# Patient Record
Sex: Female | Born: 1940 | Race: White | Hispanic: No | Smoking: Never smoker
Health system: Southern US, Community
[De-identification: ages and names within clinical notes are randomized; demographics above are authoritative.]

## PROBLEM LIST (undated history)

## (undated) DIAGNOSIS — K5900 Constipation, unspecified: Secondary | ICD-10-CM

## (undated) DIAGNOSIS — N329 Bladder disorder, unspecified: Secondary | ICD-10-CM

## (undated) DIAGNOSIS — Z9889 Other specified postprocedural states: Secondary | ICD-10-CM

## (undated) DIAGNOSIS — H40009 Preglaucoma, unspecified, unspecified eye: Secondary | ICD-10-CM

## (undated) DIAGNOSIS — N819 Female genital prolapse, unspecified: Secondary | ICD-10-CM

## (undated) DIAGNOSIS — R112 Nausea with vomiting, unspecified: Secondary | ICD-10-CM

## (undated) DIAGNOSIS — N816 Rectocele: Secondary | ICD-10-CM

## (undated) DIAGNOSIS — Z973 Presence of spectacles and contact lenses: Secondary | ICD-10-CM

## (undated) HISTORY — PX: TONSILLECTOMY: SUR1361

## (undated) HISTORY — PX: BREAST BIOPSY: SHX20

## (undated) HISTORY — PX: VAGINAL HYSTERECTOMY: SUR661

## (undated) HISTORY — PX: OTHER SURGICAL HISTORY: SHX169

## (undated) HISTORY — DX: Rectocele: N81.6

---

## 2001-09-18 ENCOUNTER — Encounter: Admission: RE | Admit: 2001-09-18 | Discharge: 2001-09-18 | Payer: Self-pay | Admitting: General Practice

## 2001-09-18 ENCOUNTER — Encounter: Payer: Self-pay | Admitting: General Practice

## 2003-01-24 ENCOUNTER — Encounter: Payer: Self-pay | Admitting: General Practice

## 2003-01-24 ENCOUNTER — Encounter: Admission: RE | Admit: 2003-01-24 | Discharge: 2003-01-24 | Payer: Self-pay | Admitting: General Practice

## 2005-04-11 ENCOUNTER — Encounter: Admission: RE | Admit: 2005-04-11 | Discharge: 2005-04-11 | Payer: Self-pay | Admitting: Internal Medicine

## 2008-10-06 ENCOUNTER — Encounter: Admission: RE | Admit: 2008-10-06 | Discharge: 2008-10-06 | Payer: Self-pay | Admitting: Internal Medicine

## 2009-10-09 ENCOUNTER — Encounter: Admission: RE | Admit: 2009-10-09 | Discharge: 2009-10-09 | Payer: Self-pay | Admitting: Family Medicine

## 2010-09-27 ENCOUNTER — Other Ambulatory Visit: Payer: Self-pay | Admitting: Family Medicine

## 2010-09-27 DIAGNOSIS — Z1231 Encounter for screening mammogram for malignant neoplasm of breast: Secondary | ICD-10-CM

## 2010-10-12 ENCOUNTER — Ambulatory Visit
Admission: RE | Admit: 2010-10-12 | Discharge: 2010-10-12 | Disposition: A | Discharge status: Home / Self Care | Payer: Medicare Other | Source: Ambulatory Visit | Attending: Family Medicine | Admitting: Family Medicine

## 2010-10-12 DIAGNOSIS — Z1231 Encounter for screening mammogram for malignant neoplasm of breast: Secondary | ICD-10-CM

## 2010-10-13 ENCOUNTER — Other Ambulatory Visit: Payer: Self-pay | Admitting: Family Medicine

## 2010-10-13 DIAGNOSIS — R928 Other abnormal and inconclusive findings on diagnostic imaging of breast: Secondary | ICD-10-CM

## 2010-10-15 ENCOUNTER — Ambulatory Visit
Admission: RE | Admit: 2010-10-15 | Discharge: 2010-10-15 | Disposition: A | Discharge status: Home / Self Care | Payer: Medicare PPO | Source: Ambulatory Visit | Attending: Family Medicine | Admitting: Family Medicine

## 2010-10-15 DIAGNOSIS — R928 Other abnormal and inconclusive findings on diagnostic imaging of breast: Secondary | ICD-10-CM

## 2011-03-07 ENCOUNTER — Other Ambulatory Visit: Payer: Self-pay | Admitting: Family Medicine

## 2011-03-07 DIAGNOSIS — N6310 Unspecified lump in the right breast, unspecified quadrant: Secondary | ICD-10-CM

## 2011-04-05 ENCOUNTER — Other Ambulatory Visit: Payer: Self-pay | Admitting: Family Medicine

## 2011-04-05 ENCOUNTER — Ambulatory Visit
Admission: RE | Admit: 2011-04-05 | Discharge: 2011-04-05 | Disposition: A | Discharge status: Home / Self Care | Payer: Medicare PPO | Source: Ambulatory Visit | Attending: Family Medicine | Admitting: Family Medicine

## 2011-04-05 DIAGNOSIS — N6310 Unspecified lump in the right breast, unspecified quadrant: Secondary | ICD-10-CM

## 2011-04-11 ENCOUNTER — Ambulatory Visit
Admission: RE | Admit: 2011-04-11 | Discharge: 2011-04-11 | Disposition: A | Discharge status: Home / Self Care | Payer: Medicare PPO | Source: Ambulatory Visit | Attending: Family Medicine | Admitting: Family Medicine

## 2011-04-11 DIAGNOSIS — N6310 Unspecified lump in the right breast, unspecified quadrant: Secondary | ICD-10-CM

## 2011-04-13 HISTORY — PX: BREAST BIOPSY: SHX20

## 2011-07-01 ENCOUNTER — Encounter (HOSPITAL_BASED_OUTPATIENT_CLINIC_OR_DEPARTMENT_OTHER): Payer: Self-pay | Admitting: *Deleted

## 2011-07-01 ENCOUNTER — Other Ambulatory Visit: Payer: Self-pay | Admitting: Orthopedic Surgery

## 2011-07-01 NOTE — Progress Notes (Signed)
Pt went to [facility] after fall-kept her overnight -hit her chin,fx lt wrist-will call for notes

## 2011-07-05 ENCOUNTER — Encounter (HOSPITAL_BASED_OUTPATIENT_CLINIC_OR_DEPARTMENT_OTHER): Payer: Self-pay | Admitting: Anesthesiology

## 2011-07-05 ENCOUNTER — Ambulatory Visit (HOSPITAL_BASED_OUTPATIENT_CLINIC_OR_DEPARTMENT_OTHER): Payer: Medicare PPO | Admitting: Anesthesiology

## 2011-07-05 ENCOUNTER — Encounter (HOSPITAL_BASED_OUTPATIENT_CLINIC_OR_DEPARTMENT_OTHER)
Admission: RE | Disposition: A | Discharge status: Home / Self Care | Payer: Self-pay | Source: Ambulatory Visit | Attending: Orthopedic Surgery

## 2011-07-05 ENCOUNTER — Ambulatory Visit (HOSPITAL_BASED_OUTPATIENT_CLINIC_OR_DEPARTMENT_OTHER)
Admission: RE | Admit: 2011-07-05 | Discharge: 2011-07-05 | Disposition: A | Discharge status: Home / Self Care | Payer: Medicare PPO | Source: Ambulatory Visit | Attending: Orthopedic Surgery | Admitting: Orthopedic Surgery

## 2011-07-05 ENCOUNTER — Encounter (HOSPITAL_BASED_OUTPATIENT_CLINIC_OR_DEPARTMENT_OTHER): Payer: Self-pay | Admitting: *Deleted

## 2011-07-05 DIAGNOSIS — Y998 Other external cause status: Secondary | ICD-10-CM | POA: Insufficient documentation

## 2011-07-05 DIAGNOSIS — W19XXXA Unspecified fall, initial encounter: Secondary | ICD-10-CM | POA: Insufficient documentation

## 2011-07-05 DIAGNOSIS — S52599A Other fractures of lower end of unspecified radius, initial encounter for closed fracture: Secondary | ICD-10-CM | POA: Insufficient documentation

## 2011-07-05 DIAGNOSIS — Y92009 Unspecified place in unspecified non-institutional (private) residence as the place of occurrence of the external cause: Secondary | ICD-10-CM | POA: Insufficient documentation

## 2011-07-05 HISTORY — DX: Other specified postprocedural states: R11.2

## 2011-07-05 HISTORY — DX: Other specified postprocedural states: Z98.890

## 2011-07-05 HISTORY — PX: ORIF RADIAL FRACTURE: SHX5113

## 2011-07-05 LAB — POCT HEMOGLOBIN-HEMACUE: Hemoglobin: 13.8 g/dL (ref 12.0–15.0)

## 2011-07-05 SURGERY — OPEN REDUCTION INTERNAL FIXATION (ORIF) RADIAL FRACTURE
Anesthesia: General | Site: Wrist | Laterality: Left | Wound class: Clean

## 2011-07-05 MED ORDER — DOXYCYCLINE HYCLATE 100 MG PO TABS: 100.0000 mg | ORAL_TABLET | Freq: Two times a day (BID) | ORAL | Status: AC

## 2011-07-05 MED ORDER — FENTANYL CITRATE 0.05 MG/ML IJ SOLN
50.0000 ug | INTRAMUSCULAR | Status: DC | PRN
  Administered 2011-07-05: 100 ug via INTRAVENOUS

## 2011-07-05 MED ORDER — DEXAMETHASONE SODIUM PHOSPHATE 10 MG/ML IJ SOLN
INTRAMUSCULAR | Status: DC | PRN
  Administered 2011-07-05: 4 mg via INTRAVENOUS

## 2011-07-05 MED ORDER — LIDOCAINE HCL (CARDIAC) 20 MG/ML IV SOLN
INTRAVENOUS | Status: DC | PRN
  Administered 2011-07-05: 50 mg via INTRAVENOUS

## 2011-07-05 MED ORDER — MIDAZOLAM HCL 2 MG/2ML IJ SOLN
0.5000 mg | INTRAMUSCULAR | Status: DC | PRN
  Administered 2011-07-05: 2 mg via INTRAVENOUS

## 2011-07-05 MED ORDER — VANCOMYCIN HCL IN DEXTROSE 1-5 GM/200ML-% IV SOLN
1000.0000 mg | Freq: Once | INTRAVENOUS | Status: AC
  Administered 2011-07-05: 1000 mg via INTRAVENOUS

## 2011-07-05 MED ORDER — IBUPROFEN 800 MG PO TABS: 800.0000 mg | ORAL_TABLET | Freq: Three times a day (TID) | ORAL | Status: AC | PRN

## 2011-07-05 MED ORDER — LIDOCAINE HCL 1 % IJ SOLN
INTRAMUSCULAR | Status: DC | PRN
  Administered 2011-07-05: 2 mL via INTRADERMAL

## 2011-07-05 MED ORDER — CHLORHEXIDINE GLUCONATE 4 % EX LIQD: 60.0000 mL | Freq: Once | CUTANEOUS | Status: DC

## 2011-07-05 MED ORDER — LACTATED RINGERS IV SOLN
INTRAVENOUS | Status: DC
  Administered 2011-07-05: 11:00:00 via INTRAVENOUS

## 2011-07-05 MED ORDER — ROPIVACAINE HCL 5 MG/ML IJ SOLN
INTRAMUSCULAR | Status: DC | PRN
  Administered 2011-07-05: 25 mL via EPIDURAL

## 2011-07-05 MED ORDER — OXYCODONE-ACETAMINOPHEN 5-325 MG PO TABS: 1.0000 | ORAL_TABLET | ORAL | Status: AC | PRN

## 2011-07-05 MED ORDER — PROPOFOL 10 MG/ML IV EMUL
INTRAVENOUS | Status: DC | PRN
  Administered 2011-07-05: 200 mg via INTRAVENOUS

## 2011-07-05 MED ORDER — ONDANSETRON HCL 4 MG/2ML IJ SOLN
INTRAMUSCULAR | Status: DC | PRN
  Administered 2011-07-05: 4 mg via INTRAVENOUS

## 2011-07-05 SURGICAL SUPPLY — 69 items
BAG DECANTER FOR FLEXI CONT (MISCELLANEOUS) IMPLANT
BANDAGE ADHESIVE 1X3 (GAUZE/BANDAGES/DRESSINGS) IMPLANT
BANDAGE ELASTIC 3 VELCRO ST LF (GAUZE/BANDAGES/DRESSINGS) ×4 IMPLANT
BANDAGE GAUZE ELAST BULKY 4 IN (GAUZE/BANDAGES/DRESSINGS) ×4 IMPLANT
BLADE MINI RND TIP GREEN BEAV (BLADE) ×2 IMPLANT
BLADE SURG 15 STRL LF DISP TIS (BLADE) ×2 IMPLANT
BLADE SURG 15 STRL SS (BLADE) ×2
BNDG ESMARK 4X9 LF (GAUZE/BANDAGES/DRESSINGS) ×2 IMPLANT
BONE CHIP PRESERV 5CC PCAN5 (Bone Implant) ×2 IMPLANT
BRUSH SCRUB EZ PLAIN DRY (MISCELLANEOUS) ×2 IMPLANT
CANISTER SUCTION 1200CC (MISCELLANEOUS) ×2 IMPLANT
CLOTH BEACON ORANGE TIMEOUT ST (SAFETY) ×2 IMPLANT
CORDS BIPOLAR (ELECTRODE) ×2 IMPLANT
COVER MAYO STAND STRL (DRAPES) ×2 IMPLANT
COVER TABLE BACK 60X90 (DRAPES) ×2 IMPLANT
CUFF TOURNIQUET SINGLE 18IN (TOURNIQUET CUFF) ×2 IMPLANT
DECANTER SPIKE VIAL GLASS SM (MISCELLANEOUS) IMPLANT
DRAPE EXTREMITY T 121X128X90 (DRAPE) ×2 IMPLANT
DRAPE OEC MINIVIEW 54X84 (DRAPES) ×2 IMPLANT
DRAPE SURG 17X23 STRL (DRAPES) ×2 IMPLANT
GAUZE XEROFORM 1X8 LF (GAUZE/BANDAGES/DRESSINGS) IMPLANT
GLOVE BIO SURGEON STRL SZ7 (GLOVE) ×2 IMPLANT
GLOVE BIOGEL M STRL SZ7.5 (GLOVE) ×4 IMPLANT
GLOVE ORTHO TXT STRL SZ7.5 (GLOVE) ×2 IMPLANT
GOWN PREVENTION PLUS XLARGE (GOWN DISPOSABLE) ×2 IMPLANT
GOWN PREVENTION PLUS XXLARGE (GOWN DISPOSABLE) IMPLANT
LOOP VESSEL MAXI BLUE (MISCELLANEOUS) IMPLANT
NEEDLE 27GAX1X1/2 (NEEDLE) IMPLANT
NS IRRIG 1000ML POUR BTL (IV SOLUTION) ×2 IMPLANT
PACK BASIN DAY SURGERY FS (CUSTOM PROCEDURE TRAY) ×2 IMPLANT
PAD CAST 3X4 CTTN HI CHSV (CAST SUPPLIES) ×1 IMPLANT
PADDING CAST ABS 4INX4YD NS (CAST SUPPLIES) ×1
PADDING CAST ABS COTTON 4X4 ST (CAST SUPPLIES) ×1 IMPLANT
PADDING CAST COTTON 3X4 STRL (CAST SUPPLIES) ×1
PEG SUBCHONDRAL SMOOTH 2.0X16 (Peg) ×2 IMPLANT
PEG SUBCHONDRAL SMOOTH 2.0X18 (Peg) ×4 IMPLANT
PEG SUBCHONDRAL SMOOTH 2.0X20 (Peg) ×4 IMPLANT
PEG SUBCHONDRAL SMOOTH 2.0X22 (Peg) ×2 IMPLANT
PLATE STAN 24.4X59.5 LT (Plate) ×2 IMPLANT
SCREW BN 12X3.5XNS CORT TI (Screw) ×2 IMPLANT
SCREW BN 13X3.5XNS CORT TI (Screw) ×1 IMPLANT
SCREW CORT 3.5X10 LNG (Screw) ×2 IMPLANT
SCREW CORT 3.5X12 (Screw) ×2 IMPLANT
SCREW CORT 3.5X13 (Screw) ×1 IMPLANT
SCREW PEG LOCK 2.5X16 (Peg) ×2 IMPLANT
SLEEVE SCD COMPRESS KNEE MED (MISCELLANEOUS) ×2 IMPLANT
SLING ARM FOAM STRAP LRG (SOFTGOODS) ×2 IMPLANT
SPLINT PLASTER CAST XFAST 3X15 (CAST SUPPLIES) ×15 IMPLANT
SPLINT PLASTER XTRA FASTSET 3X (CAST SUPPLIES) ×15
SPONGE GAUZE 4X4 12PLY (GAUZE/BANDAGES/DRESSINGS) ×2 IMPLANT
STOCKINETTE 4X48 STRL (DRAPES) ×2 IMPLANT
STRIP CLOSURE SKIN 1/2X4 (GAUZE/BANDAGES/DRESSINGS) ×2 IMPLANT
SUCTION FRAZIER TIP 10 FR DISP (SUCTIONS) IMPLANT
SUT ETHIBOND 3-0 V-5 (SUTURE) IMPLANT
SUT PROLENE 3 0 PS 2 (SUTURE) ×2 IMPLANT
SUT VIC AB 0 SH 27 (SUTURE) ×2 IMPLANT
SUT VIC AB 2-0 PS2 27 (SUTURE) IMPLANT
SUT VIC AB 3-0 FS2 27 (SUTURE) IMPLANT
SUT VIC AB 4-0 BRD 54 (SUTURE) IMPLANT
SUT VIC AB 4-0 P-3 18XBRD (SUTURE) ×1 IMPLANT
SUT VIC AB 4-0 P3 18 (SUTURE) ×1
SYR 3ML 23GX1 SAFETY (SYRINGE) IMPLANT
SYR BULB 3OZ (MISCELLANEOUS) ×2 IMPLANT
SYR CONTROL 10ML LL (SYRINGE) ×2 IMPLANT
TOWEL OR 17X24 6PK STRL BLUE (TOWEL DISPOSABLE) ×2 IMPLANT
TRAY DSU PREP LF (CUSTOM PROCEDURE TRAY) ×2 IMPLANT
TUBE CONNECTING 20X1/4 (TUBING) ×4 IMPLANT
UNDERPAD 30X30 INCONTINENT (UNDERPADS AND DIAPERS) ×2 IMPLANT
WATER STERILE IRR 1000ML POUR (IV SOLUTION) IMPLANT

## 2011-07-05 NOTE — Anesthesia Procedure Notes (Addendum)
Anesthesia Regional Block:  Supraclavicular block  Pre-Anesthetic Checklist: ,, timeout performed, Correct Patient, Correct Site, Correct Laterality, Correct Procedure, Correct Position, site marked, Risks and benefits discussed,  Surgical consent,  Pre-op evaluation,  At surgeon's request and post-op pain management  Laterality: Left  Prep: chloraprep       Needles:   Needle Type: Other   (Arrow Echogenic)   Needle Length: 9cm  Needle Gauge: 21    Additional Needles:  Procedures: ultrasound guided Supraclavicular block Narrative:  Start time: 07/05/2011 11:22 AM End time: 07/05/2011 11:28 AM Injection made incrementally with aspirations every 5 mL.  Performed by: Personally  Anesthesiologist: C Frederick  Additional Notes: Ultrasound guidance used to: id relevant anatomy, confirm needle position, local anesthetic spread, avoidance of vascular puncture. Picture saved. No complications. Block performed personally by Janetta Hora. Gelene Mink, MD    Supraclavicular block Procedure Name: LMA Insertion Performed by: Sharyne Richters Pre-anesthesia Checklist: Patient identified, Timeout performed, Emergency Drugs available, Suction available and Patient being monitored Patient Re-evaluated:Patient Re-evaluated prior to inductionOxygen Delivery Method: Circle System Utilized Preoxygenation: Pre-oxygenation with 100% oxygen Intubation Type: IV induction Ventilation: Mask ventilation without difficulty LMA: LMA with gastric port inserted LMA Size: 4.0 Number of attempts: 1 Placement Confirmation: breath sounds checked- equal and bilateral and positive ETCO2 Tube secured with: Tape Dental Injury: Teeth and Oropharynx as per pre-operative assessment

## 2011-07-05 NOTE — H&P (Signed)
  Jaime Weber is an 71 y.o. female.   Chief Complaint: Complaining of left wrist fracture HPI: . 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   She was seen at the [facility] where she was noted to have a profoundly comminuted die punch type Barton's fracture of the left distal radius.   Past Medical History  Diagnosis Date  . Complication of anesthesia   . PONV (postoperative nausea and vomiting)   . No pertinent past medical history     Past Surgical History  Procedure Date  . Vertebroplasty 2009    lumbar  . Elbow arthroplasty     rt elbow fx  . Abdominal hysterectomy   . Appendectomy   . Tonsillectomy     History reviewed. No pertinent family history. Social History:  reports that she has never smoked. She does not have any smokeless tobacco history on file. She reports that she drinks alcohol. She reports that she does not use illicit drugs.  Allergies:  Allergies  Allergen Reactions  . Cephalosporins Rash  . Penicillins Rash  . Sulfa Antibiotics Rash  . Hydrocodone   . Prednisone   . Benadryl (Diphenhydramine Hcl) Rash    Medications Prior to Admission  Medication Dose Route Frequency Provider Last Rate Last Dose  . chlorhexidine (HIBICLENS) 4 % liquid 4 application  60 mL Topical Once       . lactated ringers infusion   Intravenous Continuous Constance Goltz, MD       No current outpatient prescriptions on file as of 07/05/2011.    No results found for this or any previous visit (from the past 48 hour(s)).  No results found.   Pertinent items are noted in HPI.  Blood pressure 141/83, pulse 74, temperature 98 F (36.7 C), temperature source Oral, resp. rate 20, height 5\' 8"  (1.727 m), weight 73.936 kg (163 lb), SpO2 99.00%.  General appearance: alert Head: Normocephalic, without obvious abnormality Neck: supple,  symmetrical, trachea midline Resp: clear to auscultation bilaterally Cardio: regular rate and rhythm, S1, S2 normal, no murmur, click, rub or gallop GI: normal findings: bowel sounds normal Extremities: Left wrist is in a sugar tong type splint. This was removed. There was obvious deformity of the distal radius he showed diffuse ecchymosis and swelling from the distal forearm to the fingertips. Review of her x-rays revealed a severely comminuted Barton's type fracture of the left distal radius. Vascularly the hand was intact. Pulses: 2+ and symmetric Skin: normal Neurologic: Grossly normal    Assessment/Plan Impression: Severely comminuted left distal radius fracture  Plan patient to be taken to the operating room to undergo ORIF of left distal radius fracture with DVR plate fixation and possible bone grafting. The procedure risks benefits and postoperative course were again discussed with the patient and her daughter and they were in agreement with this plan.  DASNOIT,Pratyush Ammon J 07/05/2011, 10:55 AM    H&P documentation: 07/05/2011  -History and Physical Reviewed  -Patient has been re-examined  -No change in the plan of care  Wyn Forster, MD

## 2011-07-05 NOTE — Anesthesia Postprocedure Evaluation (Signed)
  Anesthesia Post-op Note  Patient: Jaime Weber  Procedure(s) Performed:  OPEN REDUCTION INTERNAL FIXATION (ORIF) RADIAL FRACTURE  Patient Location: PACU  Anesthesia Type: GA combined with regional for post-op pain  Level of Consciousness: awake, alert  and oriented2  Airway and Oxygen Therapy: Patient Spontanous Breathing  Post-op Pain: none  Post-op Assessment: Post-op Vital signs reviewed, Patient's Cardiovascular Status Stable, Respiratory Function Stable, Patent Airway, No signs of Nausea or vomiting and Pain level controlled  Post-op Vital Signs: Reviewed and stable  Complications: No apparent anesthesia complications

## 2011-07-05 NOTE — Brief Op Note (Signed)
07/05/2011  2:29 PM  PATIENT:  Thomasene Mohair  71 y.o. female  PRE-OPERATIVE DIAGNOSIS:  complex radius fracture left wrist, comminuted articular Barton's fracture            POST-OPERATIVE DIAGNOSIS:  complex radius fracture left wrist, comminuted articular Barton's fracture  PROCEDURE:  Procedure(s): OPEN REDUCTION INTERNAL FIXATION (ORIF) RADIUS FRACTURE LEFT WITH CANCELLOUS ALLOGRAFT BUTTRESS OF ARTICULAR FRAGMENTS  SURGEON:  Surgeon(s): Wyn Forster., MD  PHYSICIAN ASSISTANT:   ASSISTANTS:  Mallory Shirk.A-C    ANESTHESIA:   general  EBL:  Total I/O In: 2000 [I.V.:2000] Out: -   BLOOD ADMINISTERED:none  DRAINS: none   LOCAL MEDICATIONS USED:  NONE  SPECIMEN:  No Specimen  DISPOSITION OF SPECIMEN:  N/A  COUNTS:  YES  TOURNIQUET:  * Missing tourniquet times found for documented tourniquets in log:  [id number] *  DICTATION: .Other Dictation: Dictation Number [id number]  PLAN OF CARE: Discharge to home after PACU  PATIENT DISPOSITION:  PACU - hemodynamically stable.

## 2011-07-05 NOTE — Transfer of Care (Signed)
Immediate Anesthesia Transfer of Care Note  Patient: Jaime Weber  Procedure(s) Performed:  OPEN REDUCTION INTERNAL FIXATION (ORIF) RADIAL FRACTURE  Patient Location: PACU  Anesthesia Type: General  Level of Consciousness: awake  Airway & Oxygen Therapy: Patient Spontanous Breathing  Post-op Assessment: Report given to PACU RN and Post -op Vital signs reviewed and stable  Post vital signs: Reviewed and stable  Complications: No apparent anesthesia complications

## 2011-07-05 NOTE — Op Note (Signed)
Op note dictated 07/05/11 161096

## 2011-07-05 NOTE — Progress Notes (Signed)
Assisted Dr. Frederick with left, ultrasound guided, supraclavicular block. Side rails up, monitors on throughout procedure. See vital signs in flow sheet. Tolerated Procedure well. 

## 2011-07-05 NOTE — Anesthesia Preprocedure Evaluation (Signed)
Anesthesia Evaluation  Patient identified by MRN, date of birth, ID band Patient awake    Reviewed: Allergy & Precautions, H&P , NPO status , Patient's Chart, lab work & pertinent test results, reviewed documented beta blocker date and time   History of Anesthesia Complications (+) PONV  Airway Mallampati: II TM Distance: >3 FB Neck ROM: full    Dental   Pulmonary neg pulmonary ROS,          Cardiovascular neg cardio ROS     Neuro/Psych Negative Neurological ROS  Negative Psych ROS   GI/Hepatic negative GI ROS, Neg liver ROS,   Endo/Other  Negative Endocrine ROS  Renal/GU negative Renal ROS  Genitourinary negative   Musculoskeletal   Abdominal   Peds  Hematology negative hematology ROS (+)   Anesthesia Other Findings See surgeon's H&P   Reproductive/Obstetrics negative OB ROS                           Anesthesia Physical Anesthesia Plan  ASA: II  Anesthesia Plan: General   Post-op Pain Management: MAC Combined w/ Regional for Post-op pain   Induction: Intravenous  Airway Management Planned: LMA  Additional Equipment:   Intra-op Plan:   Post-operative Plan: Extubation in OR  Informed Consent: I have reviewed the patients History and Physical, chart, labs and discussed the procedure including the risks, benefits and alternatives for the proposed anesthesia with the patient or authorized representative who has indicated his/her understanding and acceptance.     Plan Discussed with: CRNA and Surgeon  Anesthesia Plan Comments:         Anesthesia Quick Evaluation

## 2011-07-06 ENCOUNTER — Encounter (HOSPITAL_BASED_OUTPATIENT_CLINIC_OR_DEPARTMENT_OTHER): Payer: Self-pay | Admitting: Orthopedic Surgery

## 2011-07-06 NOTE — Op Note (Signed)
NAME:  Jaime Weber, Jaime Weber                      ACCOUNT NO.:  MEDICAL RECORD NO.:  [id number]  LOCATION:                                 FACILITY:  PHYSICIAN:  Katy Fitch. Elloise Roark, M.D.      DATE OF BIRTH:  DATE OF PROCEDURE:  07/05/2011 DATE OF DISCHARGE:                              OPERATIVE REPORT   PREOPERATIVE DIAGNOSIS:  Comminuted intra-articular Laurence Compton type fracture of left distal radius with multiple depressed intra-articular fracture fragments including radial styloid, scaphoid facet and lunate facet of distal radius with comminution of volar cortex.  POSTOPERATIVE DIAGNOSIS:  Comminuted intra-articular Laurence Compton type fracture of left distal radius with multiple depressed intra-articular fracture fragments including radial styloid, scaphoid facet and lunate facet of distal radius with comminution of volar cortex.  OPERATIONS:  Open reduction and internal fixation of left hand or more part intra-articular distal radius fracture with comminution of volar cortex utilizing a 7-peg standard DVR plate system, and extensive subarticular cancellous allograft bone graft.  SURGEON:  Katy Fitch. Pocahontas Cohenour, MD  ASSISTANT:  Marveen Reeks Dasnoit, PA  ANESTHESIA:  General by LMA supplemented by a left plexus block.  SUPERVISING ANESTHESIOLOGIST:  Janetta Hora. Gelene Mink, M.D.  INDICATIONS:  Jaime Weber is a 71 year old woman, referred through the courtesy of Dr. Andi Hence, orthopedic surgeon, [facility], [place] for evaluation and management of a very comminuted articular Laurence Compton fracture of the left wrist.  On June 29, 2011, Jaime Weber fell at home, sustaining a very complex fracture of her distal radius.  She was seen at the [facility] by Dr. Orvan Falconer who performed a closed reduction, improving the overall position of her profoundly comminuted articular fracture.  Due to the complex to the articular fracture, she was referred for an upper extremity orthopedic consult  by Dr. Orvan Falconer at [facility].  Ms. Watters was seen and had a detailed informed consent provided at the office.  We pointed out that she had a very complicated intra-articular fracture of the distal radius that would require open reduction and internal fixation.  We felt that we could probably repair this with a combination of bone graft and volar plate fixation, however, sometimes external fixation coupled with plate fixation is required and sometimes bridge plating is required.  After informed consent, she was brought to the operating room at this time.  Preoperatively, she was interviewed by Dr. Gelene Mink of the Anesthesia Service.  General anesthesia by LMA technique was recommended and accepted as well as a preoperative plexus block.  This was placed with ultrasound control without complication, leading to excellent anesthesia of the left arm.  Preoperatively, she was once again reminded of the potential risks and benefits of surgery.  The goal of surgery is to obtain and maintain as near anatomic as possible, reduction of articular fracture fragments. We will utilize bone graft to buttress her joint fracture fragments as well as a volar plate system with peg's.  She understands the potential risks which include infection, anesthetic risks, neurovascular injury, and a possible development of a malunion or loss of the internal fixation of this fracture.  After informed consent, she was brought  to the operating room at this time.  DESCRIPTION OF PROCEDURE:  Jaime Weber was brought to room 2 of the [facility] and placed in supine position on the operating table.  Under Dr. Thornton Dales direct supervision, general anesthesia by LMA technique was induced.  A 1 g of vancomycin was administered as an IV prophylactic antibiotic, being fully aware of her multiple drug allergies which include penicillin, cephalosporins, and sulfa medications.  The  left upper extremity was prepped with Betadine soap solution, sterilely draped.  A pneumatic tourniquet was applied to proximal brachium.  Following exsanguination of the left arm with Esmarch bandage, the arterial tourniquet was inflated to 220 mmHg.  Procedure commenced with a standard DVR volar incision.  The flexor carpi radialis identified. Transverse veins were electrocauterized and released with scissors.  The fascia at the floor of the flexor carpi ulnaris was released followed by ulnar retraction of the flexor pollicis longus and identification of the pronator quadratus.  There was segmental fracturing and buckling of the volar cortex.  With great care, the fracture fragments were elevated using a periosteal hinge distally.  We used a lobster claw clamp to rotate the shaft of the radius 90 degrees, exposing the subchondral bone.  Cancellous allograft that had been freeze-dried, was soaked in saline for 15 minutes and subsequently packed densely beneath the articular surface, anatomically reducing the lunate and scaphoid facets.  Multiple C-arm images were utilized to control reduction of the joint.  A specialized bone tamp was used to elevate fracture fragments beneath the scaphoid and radial styloid.  After further bone graft was packed, we were able to anatomically put back together the volar cortex followed by application of a 7-peg DVR volar plate.  This was placed with standard technique.  Radiographic control was used to assure proper position of the peg and screws.  A threaded screw was used in the radial styloid for maximum purchase.  Additional bone graft was packed deep to the insertion of the brachioradialis.  Thereafter, the pronator quadratus was repaired distally over the plate to prevent abrasion on the flexor tendons.  The wound was thoroughly irrigated with sterile saline followed by repair of the skin with subcutaneous 4-0 Vicryl and intradermal  segmental 3-0 Prolene.  Final images revealed anatomic reduction of the fracture.  The wound was then dressed with sterile gauze, sterile Kerlix, Webril, and a sugar- tong splint, maintaining the forearm in neutral supination.  There were no apparent complications.  For aftercare, Ms. Guise was placed on Dilaudid 2 mg 1 or 2 tablets p.o. q.4-6 hours p.r.n. pain, also she was placed on doxycycline 100 mg 1 p.o. b.i.d. x4 days as a oral prophylactic antibiotic.     Katy Fitch Jourdain Guay, M.D.     RVS/MEDQ  D:  07/05/2011  T:  07/06/2011  Job:  [id number]  cc:   Dr. Andi Hence

## 2011-09-21 ENCOUNTER — Other Ambulatory Visit: Payer: Self-pay | Admitting: Family Medicine

## 2011-09-21 DIAGNOSIS — Z1231 Encounter for screening mammogram for malignant neoplasm of breast: Secondary | ICD-10-CM

## 2011-10-13 ENCOUNTER — Ambulatory Visit
Admission: RE | Admit: 2011-10-13 | Discharge: 2011-10-13 | Disposition: A | Discharge status: Home / Self Care | Payer: Medicare PPO | Source: Ambulatory Visit | Attending: Family Medicine | Admitting: Family Medicine

## 2011-10-13 DIAGNOSIS — Z1231 Encounter for screening mammogram for malignant neoplasm of breast: Secondary | ICD-10-CM

## 2012-02-02 ENCOUNTER — Other Ambulatory Visit: Payer: Self-pay | Admitting: Physician Assistant

## 2012-06-27 HISTORY — PX: COLONOSCOPY: SHX174

## 2012-11-16 ENCOUNTER — Other Ambulatory Visit: Payer: Self-pay

## 2012-11-16 DIAGNOSIS — Z1231 Encounter for screening mammogram for malignant neoplasm of breast: Secondary | ICD-10-CM

## 2012-11-23 ENCOUNTER — Ambulatory Visit: Payer: Medicare PPO

## 2012-12-20 ENCOUNTER — Ambulatory Visit: Payer: Medicare PPO

## 2013-07-31 ENCOUNTER — Encounter: Payer: Self-pay | Admitting: Gastroenterology

## 2013-09-03 ENCOUNTER — Ambulatory Visit: Payer: Medicare PPO | Admitting: Gastroenterology

## 2013-09-10 ENCOUNTER — Other Ambulatory Visit: Payer: Self-pay | Admitting: Physician Assistant

## 2013-09-10 DIAGNOSIS — D229 Melanocytic nevi, unspecified: Secondary | ICD-10-CM

## 2013-09-10 HISTORY — DX: Melanocytic nevi, unspecified: D22.9

## 2013-09-13 ENCOUNTER — Encounter: Payer: Self-pay | Admitting: Gastroenterology

## 2013-10-30 ENCOUNTER — Ambulatory Visit: Payer: Medicare PPO | Admitting: Gastroenterology

## 2014-02-19 ENCOUNTER — Other Ambulatory Visit: Payer: Self-pay

## 2014-02-19 DIAGNOSIS — Z1231 Encounter for screening mammogram for malignant neoplasm of breast: Secondary | ICD-10-CM

## 2014-03-10 ENCOUNTER — Ambulatory Visit
Admission: RE | Admit: 2014-03-10 | Discharge: 2014-03-10 | Disposition: A | Discharge status: Home / Self Care | Payer: Medicare PPO | Source: Ambulatory Visit

## 2014-03-10 DIAGNOSIS — Z1231 Encounter for screening mammogram for malignant neoplasm of breast: Secondary | ICD-10-CM

## 2014-10-23 ENCOUNTER — Ambulatory Visit: Payer: Self-pay | Admitting: Podiatry

## 2014-10-28 ENCOUNTER — Ambulatory Visit (INDEPENDENT_AMBULATORY_CARE_PROVIDER_SITE_OTHER): Payer: Medicare Other

## 2014-10-28 ENCOUNTER — Encounter: Payer: Self-pay | Admitting: Podiatry

## 2014-10-28 ENCOUNTER — Ambulatory Visit (INDEPENDENT_AMBULATORY_CARE_PROVIDER_SITE_OTHER): Payer: Medicare Other | Admitting: Podiatry

## 2014-10-28 VITALS — BP 133/81 | HR 73 | Resp 15

## 2014-10-28 DIAGNOSIS — M2042 Other hammer toe(s) (acquired), left foot: Secondary | ICD-10-CM | POA: Diagnosis not present

## 2014-10-28 DIAGNOSIS — M2012 Hallux valgus (acquired), left foot: Secondary | ICD-10-CM | POA: Diagnosis not present

## 2014-10-28 DIAGNOSIS — L84 Corns and callosities: Secondary | ICD-10-CM | POA: Diagnosis not present

## 2014-10-28 DIAGNOSIS — M779 Enthesopathy, unspecified: Secondary | ICD-10-CM

## 2014-10-28 DIAGNOSIS — M21612 Bunion of left foot: Secondary | ICD-10-CM

## 2014-10-28 MED ORDER — TRIAMCINOLONE ACETONIDE 10 MG/ML IJ SUSP
10.0000 mg | Freq: Once | INTRAMUSCULAR | Status: AC
  Administered 2014-10-28: 10 mg

## 2014-10-28 NOTE — Progress Notes (Signed)
   Subjective:    Patient ID: Jaime Weber, female    DOB: August 31, 1940, 74 y.o.   MRN: [id number]  HPI Pt presents with bilateral hammertoe deformities and left foot bunion, painful to wear enclosed shoes   Review of Systems  All other systems reviewed and are negative.      Objective:   Physical Exam        Assessment & Plan:

## 2014-10-28 NOTE — Progress Notes (Signed)
Subjective:     Patient ID: Jaime Weber, female   DOB: [birth year], 74 y.o.   MRN: [id number]  HPI patient presents stating I'm having a lot of problems between the fourth and fifth toe on my left foot and elevation of my second toe left foot and structural bunion deformity. I'm not sure whether surgery is for me but the pain is bad   Review of Systems  All other systems reviewed and are negative.      Objective:   Physical Exam  Constitutional: She is oriented to person, place, and time.  Cardiovascular: Intact distal pulses.   Musculoskeletal: Normal range of motion.  Neurological: She is oriented to person, place, and time.  Skin: Skin is warm.  Nursing note and vitals reviewed.  neurovascular status intact with muscle strength adequate range of motion within normal limits. Patient's found to have good digital perfusion is well oriented 3 and is noted to have keratotic lesion fourth toe left with fluid buildup around the interphalangeal joint with keratotic tissue and elevated second toe left with structural bunion deformity. Patient does have pain mostly in the fourth toe but also second toe and bunion     Assessment:     Inflammatory capsulitis interphalangeal joint fourth toe left with keratotic lesion and structural bunion hammertoe deformity    Plan:     H&P and x-rays reviewed with patient. Careful injection administered into the interphalangeal joint fourth toe left with deep debridement accomplished and then discussed surgical intervention which could be obtained for the second toe structural bunion and hammertoe deformity. Reappoint one month to reevaluate how she responded to conservative care

## 2014-11-27 ENCOUNTER — Ambulatory Visit (INDEPENDENT_AMBULATORY_CARE_PROVIDER_SITE_OTHER): Payer: Medicare Other | Admitting: Podiatry

## 2014-11-27 ENCOUNTER — Encounter: Payer: Self-pay | Admitting: Podiatry

## 2014-11-27 VITALS — BP 162/86 | HR 69 | Resp 12

## 2014-11-27 DIAGNOSIS — M2012 Hallux valgus (acquired), left foot: Secondary | ICD-10-CM | POA: Diagnosis not present

## 2014-11-27 DIAGNOSIS — M2042 Other hammer toe(s) (acquired), left foot: Secondary | ICD-10-CM | POA: Diagnosis not present

## 2014-11-27 DIAGNOSIS — M21612 Bunion of left foot: Secondary | ICD-10-CM

## 2014-11-28 NOTE — Progress Notes (Signed)
Subjective:     Patient ID: Jaime Weber, female   DOB: 1940-09-22, 74 y.o.   MRN: [id number]  HPI patient states I'm feeling better between the fourth and fifth toes on my left foot but I know I need to get the second toe fixed and my bunion fixed if they've remained tender and I been trying shoe gear modification for a long time   Review of Systems     Objective:   Physical Exam Neurovascular status intact muscle strength adequate range of motion within normal limits with keratotic inflammation between the fourth and fifth toes left that is resolved and noted to have large hyperostosis medial aspect first metatarsal left with deviation the hallux against second toe and elevated second toe with pain    Assessment:     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 Explain will not get full correction of the bunion but I do think it will work for her well and will allow her to be weightbearing postoperatively. Patient would like to undergo this course of treatment and wants to wait to the fall and we'll schedule when convenient for her and we'll see her prior to review in great detail the surgery that we'll be necessary

## 2014-12-08 ENCOUNTER — Telehealth: Payer: Self-pay | Admitting: *Deleted

## 2014-12-08 NOTE — Telephone Encounter (Signed)
I called patient to inform her that we needed to move her surgery from 04/07/2015 instead of 05/07/2015.  Tuesday is his surgery date.  "I knew that you all would figure it out.  I know his surgery date is on Tuesday.  I'll be back there at the end of September to sign the papers."

## 2015-02-24 ENCOUNTER — Other Ambulatory Visit: Payer: Self-pay

## 2015-02-24 DIAGNOSIS — Z1231 Encounter for screening mammogram for malignant neoplasm of breast: Secondary | ICD-10-CM

## 2015-03-16 ENCOUNTER — Ambulatory Visit
Admission: RE | Admit: 2015-03-16 | Discharge: 2015-03-16 | Disposition: A | Discharge status: Home / Self Care | Payer: Medicare Other | Source: Ambulatory Visit

## 2015-03-16 DIAGNOSIS — Z1231 Encounter for screening mammogram for malignant neoplasm of breast: Secondary | ICD-10-CM

## 2015-03-17 ENCOUNTER — Telehealth: Payer: Self-pay | Admitting: *Deleted

## 2015-03-17 NOTE — Telephone Encounter (Signed)
"  I have an appointment for 10/06 to come in at 4:15pm.  I'm scheduled to have surgery on 10/11.  I'm going to have to call that off because I'm having some other problems.  If you want to, give me a call."  I'm returning your call.  Did you want to cancel surgery or reschedule it?  "Well I'm going to have to call back.  I don't know when my leg and my knee is going to get better."  Okay, I hope you feel better soon.  Give [place] a call if you'd like to reschedule.  I called and canceled surgery at the surgical center.

## 2015-03-22 IMAGING — MG MM SCREENING BREAST TOMO BILATERAL
8 series · 9 of 24 positions shown · non-contrast
Comparison: Previous exam(s).

CLINICAL DATA: Screening.

EXAM:
DIGITAL SCREENING BILATERAL MAMMOGRAM WITH 3D TOMO WITH CAD
DIGITAL BREAST TOMOSYNTHESIS
Digital breast tomosynthesis images are acquired in two projections.
These images are reviewed in combination with the digital mammogram,
confirming the findings below.

[L CC]
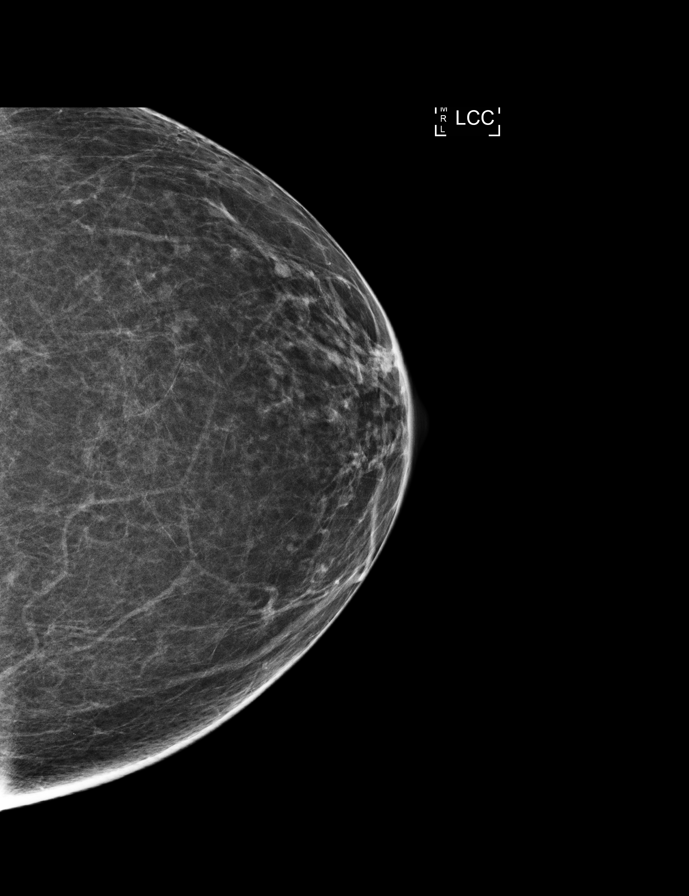

[R MLO]
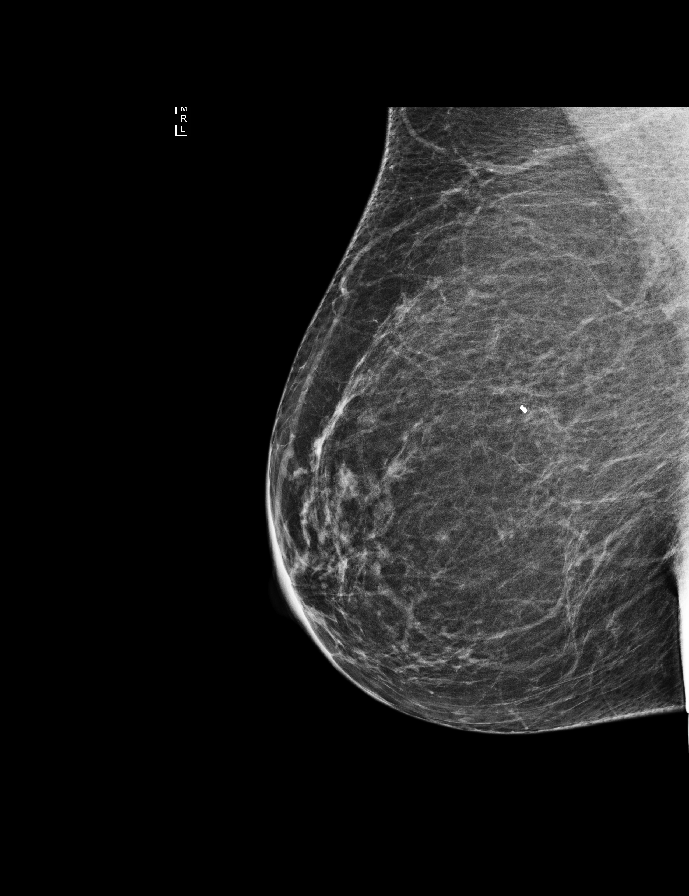

[L MLO]
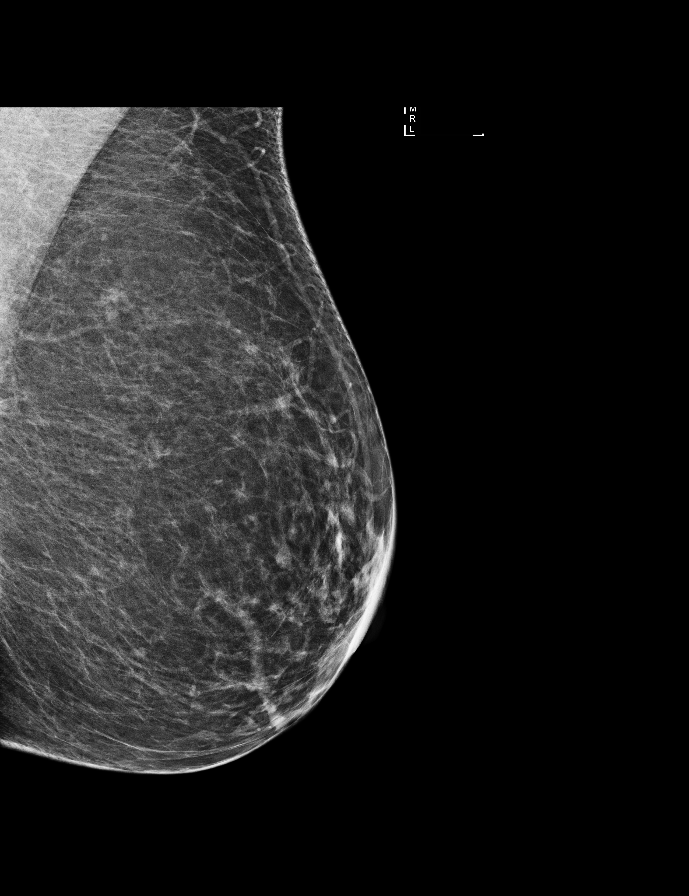

[R CC]
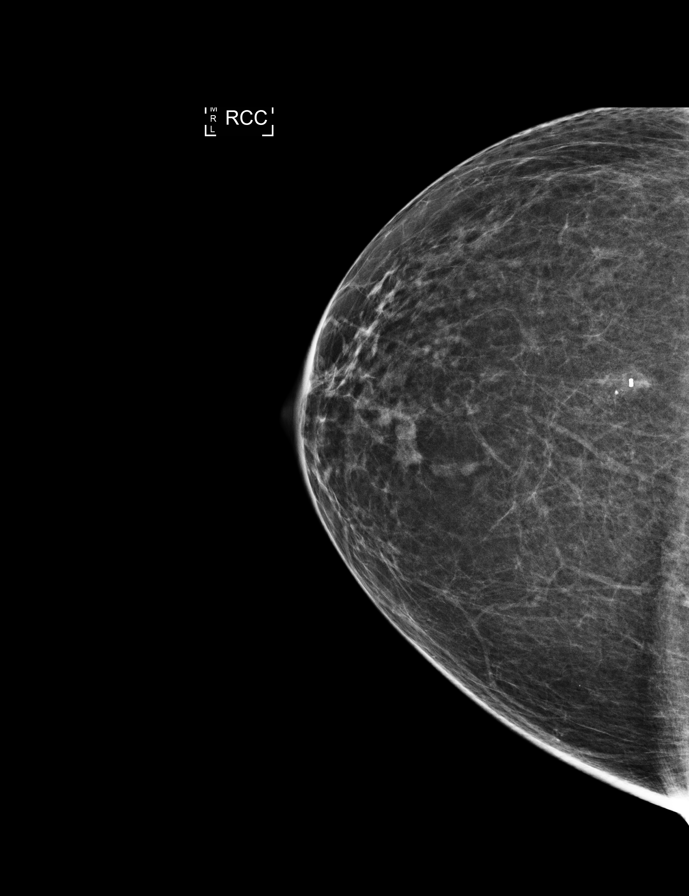

[R CC tomo · 2 of 59 frames shown]
[frame 20/59]
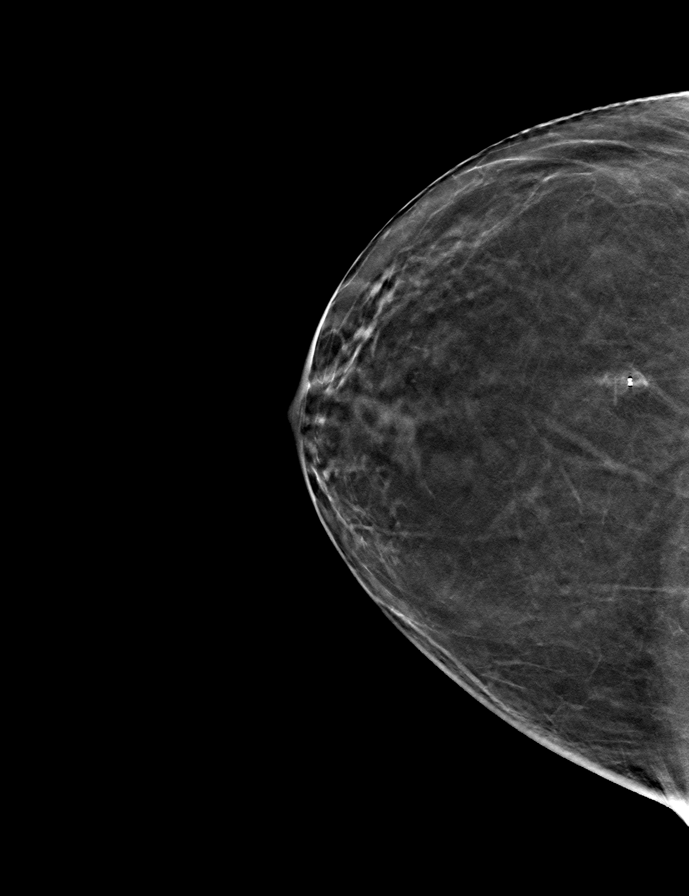
[frame 30/59]
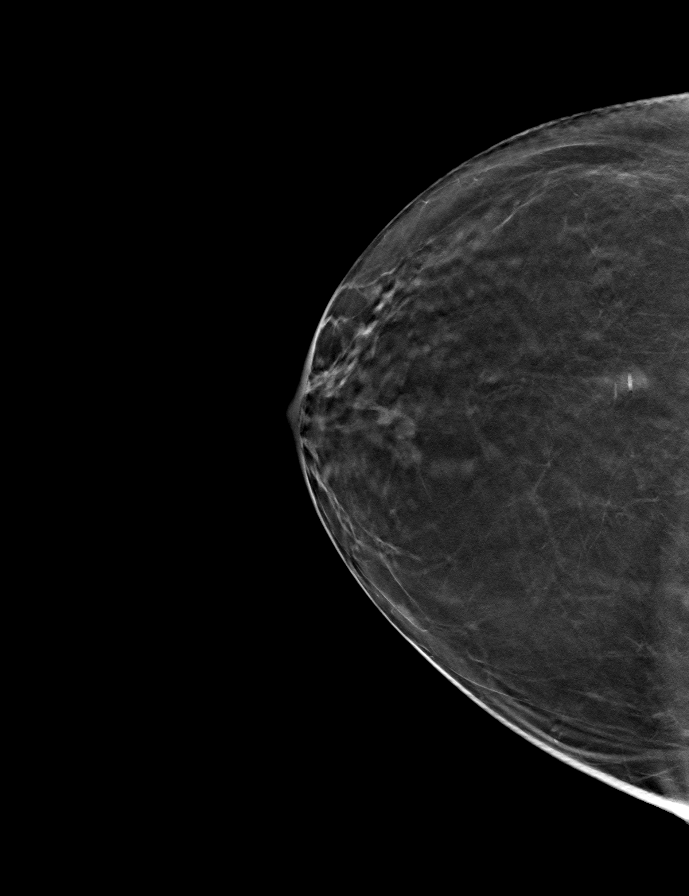

[L MLO tomo · tomo slice 35/68.0]
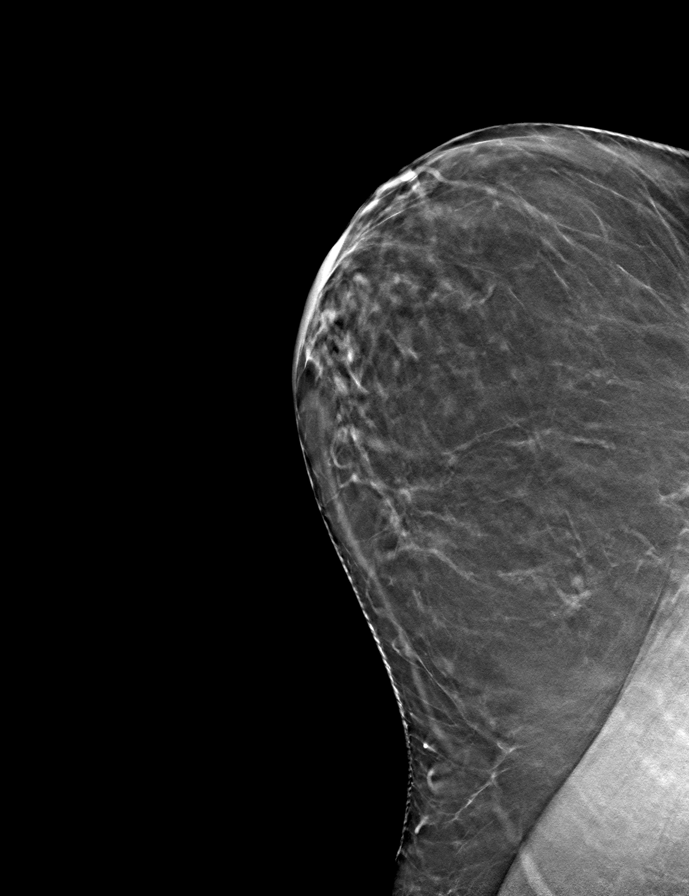

[L CC tomo · tomo slice 30/59.0]
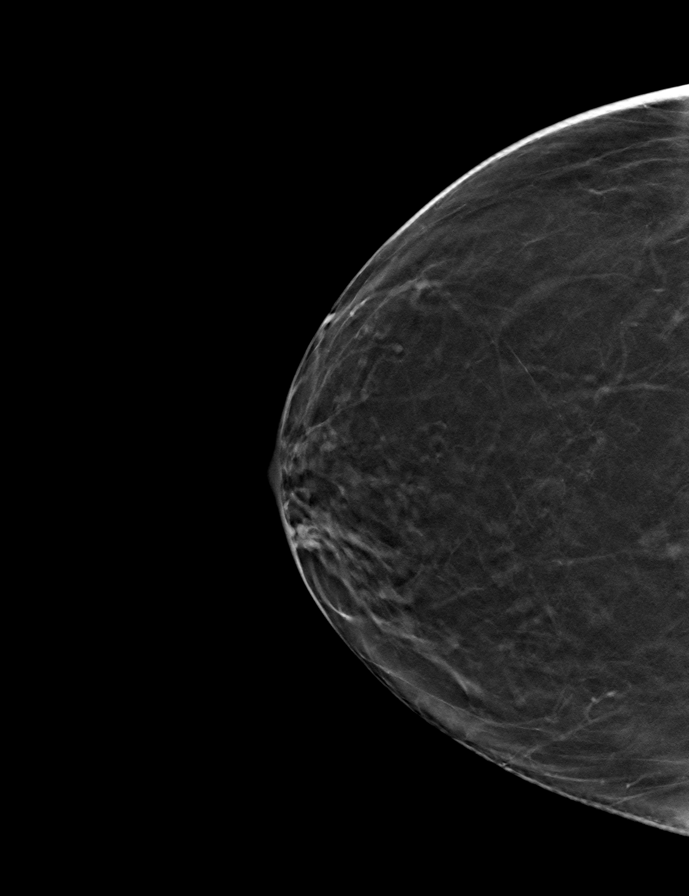

[R MLO tomo · tomo slice 35/70.0]
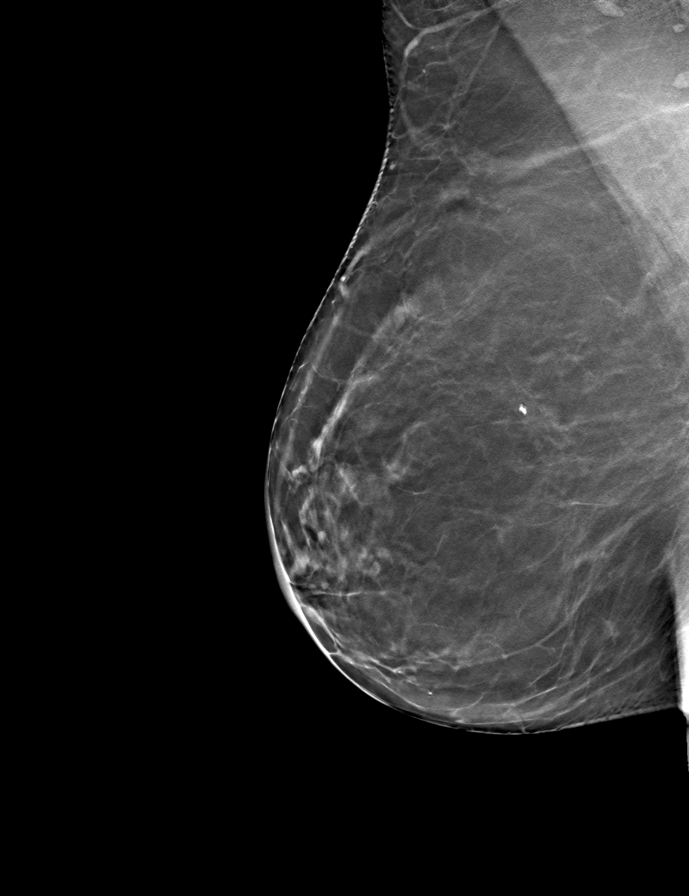

[9 of 24 positions shown; findings below may reference images not displayed]

ACR Breast Density Category b: There are scattered areas of
fibroglandular density.
FINDINGS: There are no findings suspicious for malignancy. Images were
processed with CAD.
IMPRESSION: No mammographic evidence of malignancy. A result letter of this
screening mammogram will be mailed directly to the patient.

RECOMMENDATION:
Screening mammogram in one year. (Code:LC-5-VFV)

BI-RADS CATEGORY  1: Negative.

## 2015-03-30 ENCOUNTER — Ambulatory Visit: Payer: Medicare Other | Admitting: Podiatry

## 2015-04-14 ENCOUNTER — Other Ambulatory Visit: Payer: Medicare Other

## 2015-04-17 ENCOUNTER — Other Ambulatory Visit: Payer: Self-pay

## 2015-05-05 ENCOUNTER — Encounter: Payer: Self-pay | Admitting: Internal Medicine

## 2015-07-07 ENCOUNTER — Other Ambulatory Visit: Payer: Self-pay | Admitting: Urology

## 2015-07-08 ENCOUNTER — Encounter (HOSPITAL_BASED_OUTPATIENT_CLINIC_OR_DEPARTMENT_OTHER): Payer: Self-pay | Admitting: *Deleted

## 2015-07-09 ENCOUNTER — Encounter (HOSPITAL_BASED_OUTPATIENT_CLINIC_OR_DEPARTMENT_OTHER): Payer: Self-pay | Admitting: *Deleted

## 2015-07-09 ENCOUNTER — Ambulatory Visit: Payer: Medicare Other | Admitting: Internal Medicine

## 2015-07-09 NOTE — Progress Notes (Addendum)
NPO AFTER MN W/ EXCEPTION CLEAR LIQUIDS UNTIL 0800 (NO CREAM/ MILK PRODUCTS).  ARRIVE AT [id number].  NEEDS HG.  PT TO CALL BACK WITH MED LIST THAT INCLUDES VITAMINS/ SUPPLEMENTS  AND  VERIFIY ALLERY LIST.  RECEIVED PT'S FAX W/ LIST OF SUPPLEMENT'S AND ALLERGIES.  UPDATED MED. LIST BUT PAGE W/ ALLERGIES UNABLE TO READ.  CALL AND SPOKE W/ PT THAT WILL NEED TO BRING IN LIST OF MEDICATION ALLERGIES AND REACTIONS ON DOS, VERBALIZED UNDERSTANDING.

## 2015-07-10 ENCOUNTER — Other Ambulatory Visit: Payer: Self-pay | Admitting: Urology

## 2015-07-13 ENCOUNTER — Ambulatory Visit (HOSPITAL_BASED_OUTPATIENT_CLINIC_OR_DEPARTMENT_OTHER): Payer: Medicare Other | Admitting: Anesthesiology

## 2015-07-13 ENCOUNTER — Ambulatory Visit (HOSPITAL_BASED_OUTPATIENT_CLINIC_OR_DEPARTMENT_OTHER)
Admission: RE | Admit: 2015-07-13 | Discharge: 2015-07-13 | Disposition: A | Discharge status: Home / Self Care | Payer: Medicare Other | Source: Ambulatory Visit | Attending: Urology | Admitting: Urology

## 2015-07-13 ENCOUNTER — Encounter (HOSPITAL_BASED_OUTPATIENT_CLINIC_OR_DEPARTMENT_OTHER): Payer: Self-pay | Admitting: *Deleted

## 2015-07-13 ENCOUNTER — Encounter (HOSPITAL_BASED_OUTPATIENT_CLINIC_OR_DEPARTMENT_OTHER)
Admission: RE | Disposition: A | Discharge status: Home / Self Care | Payer: Self-pay | Source: Ambulatory Visit | Attending: Urology

## 2015-07-13 DIAGNOSIS — K219 Gastro-esophageal reflux disease without esophagitis: Secondary | ICD-10-CM | POA: Diagnosis not present

## 2015-07-13 DIAGNOSIS — Z79899 Other long term (current) drug therapy: Secondary | ICD-10-CM | POA: Insufficient documentation

## 2015-07-13 DIAGNOSIS — N3090 Cystitis, unspecified without hematuria: Secondary | ICD-10-CM | POA: Insufficient documentation

## 2015-07-13 DIAGNOSIS — R300 Dysuria: Secondary | ICD-10-CM | POA: Diagnosis present

## 2015-07-13 HISTORY — PX: CYSTOSCOPY WITH BIOPSY: SHX5122

## 2015-07-13 HISTORY — DX: Bladder disorder, unspecified: N32.9

## 2015-07-13 HISTORY — DX: Constipation, unspecified: K59.00

## 2015-07-13 HISTORY — DX: Preglaucoma, unspecified, unspecified eye: H40.009

## 2015-07-13 HISTORY — PX: CYSTOSCOPY W/ RETROGRADES: SHX1426

## 2015-07-13 HISTORY — DX: Presence of spectacles and contact lenses: Z97.3

## 2015-07-13 LAB — POCT HEMOGLOBIN-HEMACUE: Hemoglobin: 14.9 g/dL (ref 12.0–15.0)

## 2015-07-13 SURGERY — CYSTOSCOPY, WITH BIOPSY
Anesthesia: General | Site: Renal

## 2015-07-13 MED ORDER — PHENAZOPYRIDINE HCL 100 MG PO TABS
ORAL_TABLET | ORAL | Status: AC
  Filled 2015-07-13: qty 2

## 2015-07-13 MED ORDER — ACETAMINOPHEN 160 MG/5ML PO SOLN
650.0000 mg | Freq: Once | ORAL | Status: AC
  Administered 2015-07-13: 650 mg via ORAL
  Filled 2015-07-13: qty 20.3

## 2015-07-13 MED ORDER — LACTATED RINGERS IV SOLN
INTRAVENOUS | Status: DC
  Administered 2015-07-13 (×2): via INTRAVENOUS
  Filled 2015-07-13: qty 1000

## 2015-07-13 MED ORDER — MIDAZOLAM HCL 5 MG/5ML IJ SOLN
INTRAMUSCULAR | Status: DC | PRN
  Administered 2015-07-13 (×2): 1 mg via INTRAVENOUS

## 2015-07-13 MED ORDER — FENTANYL CITRATE (PF) 100 MCG/2ML IJ SOLN
INTRAMUSCULAR | Status: DC | PRN
  Administered 2015-07-13 (×2): 50 ug via INTRAVENOUS

## 2015-07-13 MED ORDER — FENTANYL CITRATE (PF) 100 MCG/2ML IJ SOLN
25.0000 ug | INTRAMUSCULAR | Status: DC | PRN
  Filled 2015-07-13: qty 1

## 2015-07-13 MED ORDER — PHENAZOPYRIDINE HCL 100 MG PO TABS: 100.0000 mg | ORAL_TABLET | Freq: Three times a day (TID) | ORAL | Status: DC | PRN

## 2015-07-13 MED ORDER — MAGNESIUM CITRATE PO SOLN: 1.0000 | Freq: Once | ORAL | Status: DC

## 2015-07-13 MED ORDER — ONDANSETRON HCL 4 MG/2ML IJ SOLN
INTRAMUSCULAR | Status: DC | PRN
  Administered 2015-07-13: 4 mg via INTRAVENOUS

## 2015-07-13 MED ORDER — CIPROFLOXACIN IN D5W 400 MG/200ML IV SOLN
400.0000 mg | Freq: Two times a day (BID) | INTRAVENOUS | Status: DC
  Administered 2015-07-13: 400 mg via INTRAVENOUS
  Filled 2015-07-13: qty 200

## 2015-07-13 MED ORDER — PHENAZOPYRIDINE HCL 200 MG PO TABS
200.0000 mg | ORAL_TABLET | Freq: Once | ORAL | Status: AC
  Administered 2015-07-13: 200 mg via ORAL
  Filled 2015-07-13: qty 1

## 2015-07-13 MED ORDER — DEXAMETHASONE SODIUM PHOSPHATE 4 MG/ML IJ SOLN
INTRAMUSCULAR | Status: DC | PRN
  Administered 2015-07-13: 10 mg via INTRAVENOUS

## 2015-07-13 MED ORDER — STERILE WATER FOR IRRIGATION IR SOLN
Status: DC | PRN
  Administered 2015-07-13: 3000 mL

## 2015-07-13 MED ORDER — PHENYLEPHRINE HCL 10 MG/ML IJ SOLN
INTRAMUSCULAR | Status: DC | PRN
  Administered 2015-07-13 (×2): 120 ug via INTRAVENOUS

## 2015-07-13 MED ORDER — PROPOFOL 10 MG/ML IV BOLUS
INTRAVENOUS | Status: DC | PRN
  Administered 2015-07-13: 100 mg via INTRAVENOUS

## 2015-07-13 MED ORDER — TRAMADOL HCL 50 MG PO TABS: 50.0000 mg | ORAL_TABLET | Freq: Four times a day (QID) | ORAL | Status: DC | PRN

## 2015-07-13 MED ORDER — LIDOCAINE HCL (CARDIAC) 20 MG/ML IV SOLN
INTRAVENOUS | Status: DC | PRN
  Administered 2015-07-13: 50 mg via INTRAVENOUS

## 2015-07-13 MED ORDER — CIPROFLOXACIN IN D5W 400 MG/200ML IV SOLN
INTRAVENOUS | Status: AC
  Filled 2015-07-13: qty 200

## 2015-07-13 MED ORDER — IOHEXOL 350 MG/ML SOLN
INTRAVENOUS | Status: DC | PRN
  Administered 2015-07-13: 18 mL

## 2015-07-13 SURGICAL SUPPLY — 37 items
ADAPTER CATH WHT DISP STRL (CATHETERS) IMPLANT
BAG DRAIN URO-CYSTO SKYTR STRL (DRAIN) ×3 IMPLANT
BAG URO CATCHER STRL LF (MISCELLANEOUS) ×3 IMPLANT
BASKET ZERO TIP NITINOL 2.4FR (BASKET) IMPLANT
BOOTIES KNEE HIGH SLOAN (MISCELLANEOUS) ×3 IMPLANT
CANISTER SUCT LVC 12 LTR MEDI- (MISCELLANEOUS) IMPLANT
CATH FOLEY 2WAY SLVR  5CC 14FR (CATHETERS) ×1
CATH FOLEY 2WAY SLVR 5CC 14FR (CATHETERS) ×2 IMPLANT
CATH INTERMIT  6FR 70CM (CATHETERS) ×9 IMPLANT
CATH ROBINSON RED A/P 16FR (CATHETERS) IMPLANT
CLOTH BEACON ORANGE TIMEOUT ST (SAFETY) ×3 IMPLANT
CONT SPEC 4OZ CLIKSEAL STRL BL (MISCELLANEOUS) ×6 IMPLANT
ELECT REM PT RETURN 9FT ADLT (ELECTROSURGICAL) ×3
ELECTRODE REM PT RTRN 9FT ADLT (ELECTROSURGICAL) ×2 IMPLANT
GLOVE BIO SURGEON STRL SZ7.5 (GLOVE) ×3 IMPLANT
GLOVE BIO SURGEON STRL SZ8 (GLOVE) IMPLANT
GLOVE SURG SS PI 7.5 STRL IVOR (GLOVE) ×6 IMPLANT
GOWN STRL REUS W/ TWL LRG LVL3 (GOWN DISPOSABLE) ×2 IMPLANT
GOWN STRL REUS W/ TWL XL LVL3 (GOWN DISPOSABLE) ×2 IMPLANT
GOWN STRL REUS W/TWL LRG LVL3 (GOWN DISPOSABLE) ×1
GOWN STRL REUS W/TWL XL LVL3 (GOWN DISPOSABLE) ×1
GUIDEWIRE ANG ZIPWIRE 038X150 (WIRE) IMPLANT
GUIDEWIRE STR DUAL SENSOR (WIRE) ×3 IMPLANT
IV NS IRRIG 3000ML ARTHROMATIC (IV SOLUTION) IMPLANT
KIT ROOM TURNOVER WOR (KITS) ×3 IMPLANT
MANIFOLD NEPTUNE II (INSTRUMENTS) ×3 IMPLANT
NDL SAFETY ECLIPSE 18X1.5 (NEEDLE) ×2 IMPLANT
NEEDLE HYPO 18GX1.5 SHARP (NEEDLE) ×1
NEEDLE HYPO 22GX1.5 SAFETY (NEEDLE) IMPLANT
NEEDLE SPNL 22GX7 QUINCKE BK (NEEDLE) IMPLANT
NS IRRIG 500ML POUR BTL (IV SOLUTION) ×3 IMPLANT
PACK CYSTO (CUSTOM PROCEDURE TRAY) ×3 IMPLANT
SYR 20CC LL (SYRINGE) ×6 IMPLANT
SYR BULB IRRIGATION 50ML (SYRINGE) IMPLANT
SYRINGE 10CC LL (SYRINGE) ×3 IMPLANT
TUBE CONNECTING 12X1/4 (SUCTIONS) IMPLANT
WATER STERILE IRR 3000ML UROMA (IV SOLUTION) ×3 IMPLANT

## 2015-07-13 NOTE — Anesthesia Postprocedure Evaluation (Signed)
Anesthesia Post Note  Patient: Jaime Weber  Procedure(s) Performed: Procedure(s) (LRB): CYSTOSCOPY WITH BIOPSY WITH SELECTIVE CYTOLOGIES (N/A) CYSTOSCOPY WITH BILATERAL RETROGRADE PYELOGRAM (Bilateral)  Patient location during evaluation: PACU Anesthesia Type: General Level of consciousness: awake and alert Pain management: pain level controlled Vital Signs Assessment: post-procedure vital signs reviewed and stable Respiratory status: spontaneous breathing, nonlabored ventilation and respiratory function stable Cardiovascular status: blood pressure returned to baseline and stable Postop Assessment: no signs of nausea or vomiting Anesthetic complications: no    Last Vitals:  Filed Vitals:   07/13/15 1520 07/13/15 1530  BP:  136/56  Pulse: 67 64  Temp:    Resp: 11 16    Last Pain:  Filed Vitals:   07/13/15 1540  PainSc: 4                  Darleene Cumpian,W. EDMOND

## 2015-07-13 NOTE — Discharge Instructions (Addendum)
CYSTOSCOPY HOME CARE INSTRUCTIONS  Activity: Rest for the remainder of the day.  Do not drive or operate equipment today.  You may resume normal activities in one to two days as instructed by your physician.   Meals: Drink plenty of liquids and eat light foods such as gelatin or soup this evening.  You may return to a normal meal plan tomorrow.  Return to Work: You may return to work in one to two days or as instructed by your physician.  Special Instructions / Symptoms: Call your physician if any of these symptoms occur:   -persistent or heavy bleeding  -bleeding which continues after first few urination  -large blood clots that are difficult to pass  -urine stream diminishes or stops completely  -fever equal to or higher than 101 degrees Farenheit.  -cloudy urine with a strong, foul odor  -severe pain  Females should always wipe from front to back after elimination.  You may feel some burning pain when you urinate.  This should disappear with time.        Foley Catheter Care, Adult A Foley catheter is a soft, flexible tube that is placed into the bladder to drain urine. A Foley catheter may be inserted if:  You leak urine or are not able to control when you urinate (urinary incontinence).  You are not able to urinate when you need to (urinary retention).  You had prostate surgery or surgery on the genitals.  You have certain medical conditions, such as multiple sclerosis, dementia, or a spinal cord injury. If you are going home with a Foley catheter in place, follow the instructions below. TAKING CARE OF THE CATHETER 1. Wash your hands with soap and water. 2. Using mild soap and warm water on a clean washcloth:  Clean the area on your body closest to the catheter insertion site using a circular motion, moving away from the catheter. Never wipe toward the catheter because this could sweep bacteria up into the urethra and cause infection.  Remove all traces of soap. Pat the  area dry with a clean towel. For males, reposition the foreskin. 3. Attach the catheter to your leg so there is no tension on the catheter. Use adhesive tape or a leg strap. If you are using adhesive tape, remove any sticky residue left behind by the previous tape you used. 4. Keep the drainage bag below the level of the bladder, but keep it off the floor. 5. Check throughout the day to be sure the catheter is working and urine is draining freely. Make sure the tubing does not become kinked. 6. Do not pull on the catheter or try to remove it. Pulling could damage internal tissues. TAKING CARE OF THE DRAINAGE BAGS You will be given two drainage bags to take home. One is a large overnight drainage bag, and the other is a smaller leg bag that fits underneath clothing. You may wear the overnight bag at any time, but you should never wear the smaller leg bag at night. Follow the instructions below for how to empty, change, and clean your drainage bags. Emptying the Drainage Bag You must empty your drainage bag when it is  - full or at least 2-3 times a day. 1. Wash your hands with soap and water. 2. Keep the drainage bag below your hips, below the level of your bladder. This stops urine from going back into the tubing and into your bladder. 3. Hold the dirty bag over the toilet or a  clean container. 4. Open the pour spout at the bottom of the bag and empty the urine into the toilet or container. Do not let the pour spout touch the toilet, container, or any other surface. Doing so can place bacteria on the bag, which can cause an infection. 5. Clean the pour spout with a gauze pad or cotton ball that has rubbing alcohol on it. 6. Close the pour spout. 7. Attach the bag to your leg with adhesive tape or a leg strap. 8. Wash your hands well. Changing the Drainage Bag Change your drainage bag once a month or sooner if it starts to smell bad or look dirty. Below are steps to follow when changing the  drainage bag. 1. Wash your hands with soap and water. 2. Pinch off the rubber catheter so that urine does not spill out. 3. Disconnect the catheter tube from the drainage tube at the connection valve. Do not let the tubes touch any surface. 4. Clean the end of the catheter tube with an alcohol wipe. Use a different alcohol wipe to clean the end of the drainage tube. 5. Connect the catheter tube to the drainage tube of the clean drainage bag. 6. Attach the new bag to the leg with adhesive tape or a leg strap. Avoid attaching the new bag too tightly. 7. Wash your hands well. PREVENTING INFECTION  Wash your hands before and after handling your catheter.  Take showers daily and wash the area where the catheter enters your body. Do not take baths. Replace wet leg straps with dry ones, if this applies.  Do not use powders, sprays, or lotions on the genital area. Only use creams, lotions, or ointments as directed by your caregiver.  For females, wipe from front to back after each bowel movement.  Drink enough fluids to keep your urine clear or pale yellow unless you have a fluid restriction.  Do not let the drainage bag or tubing touch or lie on the floor.  Wear cotton underwear to absorb moisture and to keep your skin drier. SEEK MEDICAL CARE IF:   Your urine is cloudy or smells unusually bad.  Your catheter becomes clogged.  You are not draining urine into the bag or your bladder feels full.  Your catheter starts to leak. SEEK IMMEDIATE MEDICAL CARE IF:   You have pain, swelling, redness, or pus where the catheter enters the body.  You have pain in the abdomen, legs, lower back, or bladder.  You have a fever.  You see blood fill the catheter, or your urine is pink or red.  You have nausea, vomiting, or chills.  Your catheter gets pulled out. MAKE SURE YOU:   Understand these instructions.  Will watch your condition.  Will get help right away if you are not doing well or  get worse.   This information is not intended to replace advice given to you by your health care provider. Make sure you discuss any questions you have with your health care provider.   Document Released: 06/13/2005 Document Revised: 10/28/2013 Document Reviewed: 06/04/2012 Elsevier Interactive Patient Education 2016 [employer].  Call for an appointment to arrange follow-up.  Patient Signature:  ________________________________________________________  Nurse's Signature:  ________________________________________________________  Post Anesthesia Home Care Instructions  Activity: Get plenty of rest for the remainder of the day. A responsible adult should stay with you for 24 hours following the procedure.  For the next 24 hours, DO NOT: -Drive a car -Paediatric nurse -Drink alcoholic beverages -Take  any medication unless instructed by your physician -Make any legal decisions or sign important papers.  Meals: Start with liquid foods such as gelatin or soup. Progress to regular foods as tolerated. Avoid greasy, spicy, heavy foods. If nausea and/or vomiting occur, drink only clear liquids until the nausea and/or vomiting subsides. Call your physician if vomiting continues.  Special Instructions/Symptoms: Your throat may feel dry or sore from the anesthesia or the breathing tube placed in your throat during surgery. If this causes discomfort, gargle with warm salt water. The discomfort should disappear within 24 hours.  If you had a scopolamine patch placed behind your ear for the management of post- operative nausea and/or vomiting:  1. The medication in the patch is effective for 72 hours, after which it should be removed.  Wrap patch in a tissue and discard in the trash. Wash hands thoroughly with soap and water. 2. You may remove the patch earlier than 72 hours if you experience unpleasant side effects which may include dry mouth, dizziness or visual disturbances. 3. Avoid  touching the patch. Wash your hands with soap and water after contact with the patch.

## 2015-07-13 NOTE — H&P (Signed)
Urology Admission H&P  Chief Complaint: dysuria  History of Present Illness: Jaime Weber is a 75 yo here today for bladder biopsy and selective cytologies. She underwent office cysto which showed multiple erythematous lesions. She has a cytology that is abnormal. She has nocturia, urgency and dysuria. She has issues with severe constipation  Past Medical History  Diagnosis Date  . Lesion of bladder   . PONV (postoperative nausea and vomiting)   . Constipation   . GERD (gastroesophageal reflux disease)   . Borderline glaucoma   . Wears glasses    Past Surgical History  Procedure Laterality Date  . Orif radial fracture  07/05/2011    Procedure: OPEN REDUCTION INTERNAL FIXATION (ORIF) RADIAL FRACTURE;  Surgeon: Cammie Sickle., MD;  Location: [place];  Service: Orthopedics;  Laterality: Left;  . Vaginal hysterectomy  1980's  . Tonsillectomy  as child    Home Medications:  Prescriptions prior to admission  Medication Sig Dispense Refill Last Dose  . Ascorbic Acid (VITAMIN C) 1000 MG tablet Take 1,000 mg by mouth daily.   Past Week at Unknown time  . Cholecalciferol (VITAMIN D-3 PO) Take by mouth daily.   Past Week at Unknown time  . Digestive Enzymes (MULTI-ENZYME PO) Take by mouth daily. Whole enzyme supplement   Past Week at Unknown time  . MAGNESIUM PO Take by mouth daily. Magnesium Optimizer   Past Week at Unknown time  . Nutritional Supplements (MAXI-CALM PO) Take by mouth daily. Calm supplement for constipation   Past Week at Unknown time  . Omega-3 Fatty Acids (SUPER OMEGA 3 PO) Take by mouth daily.   Past Week at Unknown time  . OVER THE COUNTER MEDICATION daily. Anxious Less supplement   Past Week at Unknown time  . Probiotic Product ([place]) Take by mouth daily. Ultra-Flora Balance supplement   Past Week at Unknown time  . Specialty Vitamins Products (ONE-A-DAY BONE STRENGTH PO) Take by mouth daily.   Past Week at Unknown time  .  UBIQUINOL PO Take by mouth daily. Ubiquinol QH supplement   Past Week at Unknown time   Allergies:  Allergies  Allergen Reactions  . Cephalosporins Rash  . Penicillins Rash  . Sulfa Antibiotics Rash  . Hydrocodone   . Prednisone   . Adhesive [Tape] Rash  . Benadryl [Diphenhydramine Hcl] Rash    Family History  Problem Relation Age of Onset  . Cancer Father     ORAL    Social History:  reports that she has never smoked. She has never used smokeless tobacco. She reports that she drinks alcohol. She reports that she does not use illicit drugs.  Review of Systems  Genitourinary: Positive for dysuria, urgency, frequency and hematuria.  All other systems reviewed and are negative.   Physical Exam:  Vital signs in last 24 hours: Temp:  [97.7 F (36.5 C)] 97.7 F (36.5 C) (01/16 1251) Pulse Rate:  [78] 78 (01/16 1251) Resp:  [16] 16 (01/16 1251) BP: (148)/(82) 148/82 mmHg (01/16 1251) SpO2:  [100 %] 100 % (01/16 1251) Weight:  [75.297 kg (166 lb)] 75.297 kg (166 lb) (01/16 1251) Physical Exam  Constitutional: She is oriented to person, place, and time. She appears well-developed and well-nourished.  HENT:  Head: Normocephalic and atraumatic.  Eyes: EOM are normal. Pupils are equal, round, and reactive to light.  Neck: Normal range of motion. No thyromegaly present.  Cardiovascular: Normal rate and regular rhythm.   Respiratory: Effort normal.  No respiratory distress.  GI: Soft. She exhibits no distension.  Musculoskeletal: Normal range of motion.  Neurological: She is alert and oriented to person, place, and time.  Skin: Skin is warm and dry.  Psychiatric: She has a normal mood and affect. Her behavior is normal. Judgment and thought content normal.    Laboratory Data:  Results for orders placed or performed during the hospital encounter of 07/13/15 (from the past 24 hour(s))  Hemoglobin-hemacue, POC     Status: None   Collection Time: 07/13/15  1:45 PM  Result Value  Ref Range   Hemoglobin 14.9 12.0 - 15.0 g/dL   No results found for this or any previous visit (from the past 240 hour(s)). Creatinine: No results for input(s): CREATININE in the last 168 hours. 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

## 2015-07-13 NOTE — Anesthesia Preprocedure Evaluation (Addendum)
Anesthesia Evaluation  Patient identified by MRN, date of birth, ID band Patient awake    Reviewed: Allergy & Precautions, H&P , Patient's Chart, lab work & pertinent test results, reviewed documented beta blocker date and time   History of Anesthesia Complications (+) PONV  Airway Mallampati: II  TM Distance: >3 FB Neck ROM: full    Dental no notable dental hx. (+) Caps, Dental Advisory Given,    Pulmonary neg pulmonary ROS,    Pulmonary exam normal breath sounds clear to auscultation       Cardiovascular Exercise Tolerance: Good  Rhythm:regular Rate:Normal     Neuro/Psych Anxiety negative neurological ROS     GI/Hepatic negative GI ROS, Neg liver ROS, GERD  Controlled,  Endo/Other  negative endocrine ROS  Renal/GU negative Renal ROS Bladder dysfunction      Musculoskeletal negative musculoskeletal ROS (+)   Abdominal   Peds  Hematology negative hematology ROS (+)   Anesthesia Other Findings   Reproductive/Obstetrics negative OB ROS                           Anesthesia Physical Anesthesia Plan  ASA: II  Anesthesia Plan:    Post-op Pain Management:    Induction: Intravenous  Airway Management Planned: LMA  Additional Equipment:   Intra-op Plan:   Post-operative Plan:   Informed Consent: I have reviewed the patients History and Physical, chart, labs and discussed the procedure including the risks, benefits and alternatives for the proposed anesthesia with the patient or authorized representative who has indicated his/her understanding and acceptance.   Dental Advisory Given and Dental advisory given  Plan Discussed with: CRNA, Surgeon and Anesthesiologist  Anesthesia Plan Comments: (Discussed GA with LMA, possible sore throat, potential need to switch to ETT, N/V, pulmonary aspiration. Questions answered. )       Anesthesia Quick Evaluation

## 2015-07-13 NOTE — Transfer of Care (Signed)
Immediate Anesthesia Transfer of Care Note  Patient: Jaime Weber  Procedure(s) Performed: Procedure(s): CYSTOSCOPY WITH BIOPSY WITH SELECTIVE CYTOLOGIES (N/A) CYSTOSCOPY WITH BILATERAL RETROGRADE PYELOGRAM (Bilateral)  Patient Location: PACU  Anesthesia Type:General  Level of Consciousness: awake, alert , oriented and patient cooperative  Airway & Oxygen Therapy: Patient Spontanous Breathing and Patient connected to nasal cannula oxygen  Post-op Assessment: Report given to RN and Post -op Vital signs reviewed and stable  Post vital signs: Reviewed and stable  Last Vitals:  Filed Vitals:   07/13/15 1251  BP: 148/82  Pulse: 78  Temp: 36.5 C  Resp: 16    Complications: No apparent anesthesia complications

## 2015-07-13 NOTE — Brief Op Note (Signed)
07/13/2015  2:50 PM  PATIENT:  Jaime Weber  75 y.o. female  PRE-OPERATIVE DIAGNOSIS:  BLADDER LESIONS  POST-OPERATIVE DIAGNOSIS:  BLADDER LESIONS  PROCEDURE:  Procedure(s): CYSTOSCOPY WITH BIOPSY WITH SELECTIVE CYTOLOGIES (N/A) CYSTOSCOPY WITH BILATERAL RETROGRADE PYELOGRAM (Bilateral)  SURGEON:  Surgeon(s) and Role:    * Cleon Gustin, MD - Primary  PHYSICIAN ASSISTANT:   ASSISTANTS: none   ANESTHESIA:   general  EBL:     BLOOD ADMINISTERED:none  DRAINS: Urinary Catheter (Foley)   LOCAL MEDICATIONS USED:  NONE  SPECIMEN:  Source of Specimen:  bilateral renal cytologies, bladder cytology, bladder biopsy x 3  DISPOSITION OF SPECIMEN:  PATHOLOGY  COUNTS:  YES  TOURNIQUET:  * No tourniquets in log *  DICTATION: .Note written in EPIC  PLAN OF CARE: Discharge to home after PACU  PATIENT DISPOSITION:  PACU - hemodynamically stable.   Delay start of Pharmacological VTE agent (>24hrs) due to surgical blood loss or risk of bleeding: not applicable

## 2015-07-13 NOTE — Anesthesia Procedure Notes (Signed)
Procedure Name: LMA Insertion Date/Time: 07/13/2015 2:14 PM Performed by: Wanita Chamberlain Pre-anesthesia Checklist: Patient identified, Timeout performed, Emergency Drugs available, Suction available and Patient being monitored Patient Re-evaluated:Patient Re-evaluated prior to inductionOxygen Delivery Method: Circle system utilized Preoxygenation: Pre-oxygenation with 100% oxygen Intubation Type: IV induction Ventilation: Mask ventilation without difficulty LMA: LMA inserted LMA Size: 4.0 Number of attempts: 1 Airway Equipment and Method: Bite block Placement Confirmation: breath sounds checked- equal and bilateral and positive ETCO2 Tube secured with: Tape Dental Injury: Teeth and Oropharynx as per pre-operative assessment

## 2015-07-14 ENCOUNTER — Encounter (HOSPITAL_BASED_OUTPATIENT_CLINIC_OR_DEPARTMENT_OTHER): Payer: Self-pay | Admitting: Urology

## 2015-07-14 NOTE — Op Note (Signed)
Preoperative diagnosis: positive urine cytology  Postoperative diagnosis: Same  Procedure: 1 cystoscopy 2. bilateral retrograde pyelography 3. Intraoperative fluoroscopy, under one hour, with interpretation 4. Bladder biopsy with fulgeration  Attending: Nicolette Bang  Anesthesia: General  Estimated blood loss: Minimal  Drains: 18 French foley  Specimens: right and left renal cytology Bladder biopsies x 3  Antibiotics: Cipro  Findings: Ureteral orifices in normal anatomic location. No hydronephrosis or filling defects in either collecting system multiple areas of posterior wall and left lateral wall concerning for malignancy  Indications: Patient is a 75 year old female with a history of positive urine cytology. After discussing treatment options, they decided proceed with bladder biopsy and selective cytologies.  Procedure her in detail: The patient was brought to the operating room and a brief timeout was done to ensure correct patient, correct procedure, correct site. General anesthesia was administered patient was placed in dorsal lithotomy position. Their genitalia was then prepped and draped in usual sterile fashion. A rigid 11 French cystoscope was passed in the urethra and the bladder. Bladder was inspected and we noted 3 areas on posterior wall and left lateral wall that were concerning for malignancy the ureteral orifices were in the normal orthotopic locations. a 6 french ureteral catheter was then instilled into the left ureteral orifice and we then obtained a cytology. a gentle retrograde was obtained and findings noted above. We then turned our attention to the right side. a 6 french ureteral catheter was then instilled into the right ureteral orifice and we then obtained a cytology. a gentle retrograde was obtained and findings noted above. We then removed the cystoscope and placed a resectoscope into the bladder. We proceeded to obtain multiple biopsies from the  posterior wall where there were areas of erythema. Hemostasis was then obtained with a bugbee. the bladder was then drained, a 18 French foley was placed and this concluded the procedure which was well tolerated by patient.  Complications: None  Condition: Stable, extubated, transferred to PACU  Plan: Patient will be discharged home and will followup in 5 days for voiding trial

## 2015-07-29 ENCOUNTER — Encounter (INDEPENDENT_AMBULATORY_CARE_PROVIDER_SITE_OTHER): Payer: Self-pay | Admitting: *Deleted

## 2015-08-25 ENCOUNTER — Encounter: Payer: Self-pay | Admitting: Physical Therapy

## 2015-08-25 ENCOUNTER — Ambulatory Visit: Payer: Medicare Other | Attending: General Surgery | Admitting: Physical Therapy

## 2015-08-25 ENCOUNTER — Telehealth: Payer: Self-pay

## 2015-08-25 DIAGNOSIS — N8189 Other female genital prolapse: Secondary | ICD-10-CM | POA: Insufficient documentation

## 2015-08-25 DIAGNOSIS — N8184 Pelvic muscle wasting: Secondary | ICD-10-CM | POA: Diagnosis present

## 2015-08-25 DIAGNOSIS — R29898 Other symptoms and signs involving the musculoskeletal system: Secondary | ICD-10-CM

## 2015-08-25 DIAGNOSIS — M6289 Other specified disorders of muscle: Secondary | ICD-10-CM | POA: Insufficient documentation

## 2015-08-25 NOTE — Telephone Encounter (Signed)
Spoke with the patient. She is satisfied with keeping her present appointment with Dr Carlean Purl.Marland Kitchen

## 2015-08-25 NOTE — Patient Instructions (Addendum)
Toileting Techniques for Bowel Movements (Defecation) Using your belly (abdomen) and pelvic floor muscles to have a bowel movement is usually instinctive.  Sometimes people can have problems with these muscles and have to relearn proper defecation (emptying) techniques.  If you have weakness in your muscles, organs that are falling out, decreased sensation in your pelvis, or ignore your urge to go, you may find yourself straining to have a bowel movement.  You are straining if you are: . holding your breath or taking in a huge gulp of air and holding it  . keeping your lips and jaw tensed and closed tightly . turning red in the face because of excessive pushing or forcing . developing or worsening your  hemorrhoids . getting faint while pushing . not emptying completely and have to defecate many times a day  If you are straining, you are actually making it harder for yourself to have a bowel movement.  Many people find they are pulling up with the pelvic floor muscles and closing off instead of opening the anus. Due to lack pelvic floor relaxation and coordination the abdominal muscles, one has to work harder to push the feces out.  Many people have never been taught how to defecate efficiently and effectively.  Notice what happens to your body when you are having a bowel movement.  While you are sitting on the toilet pay attention to the following areas: . Jaw and mouth position . Angle of your hips   . Whether your feet touch the ground or not . Arm placement  . Spine position . Waist . Belly tension . Anus (opening of the anal canal)  An Evacuation/Defecation Plan   Here are the 4 basic points:  1. Lean forward enough for your elbows to rest on your knees 2. Support your feet on the floor or use a low stool if your feet don't touch the floor  3. Push out your belly as if you have swallowed a beach ball-you should feel a widening of your waist 4. Open and relax your pelvic floor muscles,  rather than tightening around the anus      The following conditions my require modifications to your toileting posture:  . If you have had surgery in the past that limits your back, hip, pelvic, knee or ankle flexibility . Constipation   Your healthcare practitioner may make the following additional suggestions and adjustments:  1) Sit on the toilet  a) Make sure your feet are supported. b) Notice your hip angle and spine position-most people find it effective to lean forward or raise their knees, which can help the muscles around the anus to relax  c) When you lean forward, place your forearms on your thighs for support  2) Relax suggestions a) Breath deeply in through your nose and out slowly through your mouth as if you are smelling the flowers and blowing out the candles. b) To become aware of how to relax your muscles, contracting and releasing muscles can be helpful.  Pull your pelvic floor muscles in tightly by using the image of holding back gas, or closing around the anus (visualize making a circle smaller) and lifting the anus up and in.  Then release the muscles and your anus should drop down and feel open. Repeat 5 times ending with the feeling of relaxation. c) Keep your pelvic floor muscles relaxed; let your belly bulge out. d) The digestive tract starts at the mouth and ends at the anal opening, so be   sure to relax both ends of the tube.  Place your tongue on the roof of your mouth with your teeth separated.  This helps relax your mouth and will help to relax the anus at the same time.  3) Empty (defecation) a) Keep your pelvic floor and sphincter relaxed, then bulge your anal muscles.  Make the anal opening wide.  b) Stick your belly out as if you have swallowed a beach ball. c) Make your belly wall hard using your belly muscles while continuing to breathe. Doing this makes it easier to open your anus. d) Breath out and give a grunt (or try using other sounds such as  ahhhh, shhhhh, ohhhh or grrrrrrr).  4) Finish a) As you finish your bowel movement, pull the pelvic floor muscles up and in.  This will leave your anus in the proper place rather than remaining pushed out and down. If you leave your anus pushed out and down, it will start to feel as though that is normal and give you incorrect signals about needing to have a bowel movement.    About Abdominal Massage  Abdominal massage, also called external colon massage, is a self-treatment circular massage technique that can reduce and eliminate gas and ease constipation. The colon naturally contracts in waves in a clockwise direction starting from inside the right hip, moving up toward the ribs, across the belly, and down inside the left hip.  When you perform circular abdominal massage, you help stimulate your colon's normal wave pattern of movement called peristalsis.  It is most beneficial when done after eating.  Positioning You can practice abdominal massage with oil while lying down, or in the shower with soap.  Some people find that it is just as effective to do the massage through clothing while sitting or standing.  How to Massage Start by placing your finger tips or knuckles on your right side, just inside your hip bone.  . Make small circular movements while you move upward toward your rib cage.   . Once you reach the bottom right side of your rib cage, take your circular movements across to the left side of the bottom of your rib cage.  . Next, move downward until you reach the inside of your left hip bone.  This is the path your feces travel in your colon. . Continue to perform your abdominal massage in this pattern for 10 minutes each day.     You can apply as much pressure as is comfortable in your massage.  Start gently and build pressure as you continue to practice.  Notice any areas of pain as you massage; areas of slight pain may be relieved as you massage, but if you have areas of  significant or intense pain, consult with your healthcare provider.  Other Considerations . General physical activity including bending and stretching can have a beneficial massage-like effect on the colon.  Deep breathing can also stimulate the colon because breathing deeply activates the same nervous system that supplies the colon.   . Abdominal massage should always be used in combination with a bowel-conscious diet that is high in the proper type of fiber for you, fluids (primarily water), and a regular exercise program.  Sitting    Sit comfortably. Allow body's muscles to relax. Place hands on belly. Inhale slowly and deeply for _3__ seconds, so hands move out. Then take 3___ seconds to exhale. Repeat _5__ times. Do 4___ times a day.  Copyright  VHI. All rights reserved.  [facility] [address] [place],  [address] Phone # [phone] Fax [phone]

## 2015-08-25 NOTE — Telephone Encounter (Signed)
-----   Message from Mauri Pole, MD sent at 08/24/2015 12:46 PM EST ----- Hi,  I am happy to see the patient but think the next avaliable appt for my office is somewhere in the end of April if I am correct. Beth can you please contact patient and see if she would like to keep her appt with Dr Carlean Purl or reschedule the appt  Thanks VN ----- Message -----    From: Stark Klein, MD    Sent: 08/24/2015  10:08 AM      To: Mauri Pole, MD  This lady has chronic constipation, prior GI doc in [place] did scope 2015.  Not clear why she ended up in my office.  Has appt with g[facility]ner march 17, but likes female docs and is wondering if she can get in early.    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  tx FB

## 2015-08-25 NOTE — Therapy (Signed)
[facility] [address], [place], [place], [address] Phone: [phone]   Fax:  [phone]  Physical Therapy Evaluation  Patient Details  Name: Jaime Weber MRN: [id number] Date of Birth: September 13, 1940 Referring Provider: Dr. Stark Klein  Encounter Date: 08/25/2015      PT End of Session - 08/25/15 0935    Visit Number 1   Number of Visits 10  medicare   Date for PT Re-Evaluation 10/20/15   Authorization Type g-code 10the vistit; kx mocifier after 15    PT Start Time 0850   PT Stop Time 0930   PT Time Calculation (min) 40 min   Activity Tolerance Treatment limited secondary to medical complications (Comment)   Behavior During Therapy [facility] for tasks assessed/performed      Past Medical History  Diagnosis Date  . Lesion of bladder   . PONV (postoperative nausea and vomiting)   . Constipation   . GERD (gastroesophageal reflux disease)   . Borderline glaucoma   . Wears glasses     Past Surgical History  Procedure Laterality Date  . Orif radial fracture  07/05/2011    Procedure: OPEN REDUCTION INTERNAL FIXATION (ORIF) RADIAL FRACTURE;  Surgeon: Cammie Sickle., MD;  Location: [facility];  Service: Orthopedics;  Laterality: Left;  . Vaginal hysterectomy  1980's  . Tonsillectomy  as child  . Cystoscopy with biopsy N/A 07/13/2015    Procedure: CYSTOSCOPY WITH BIOPSY WITH SELECTIVE CYTOLOGIES;  Surgeon: Cleon Gustin, MD;  Location: [facility];  Service: Urology;  Laterality: N/A;  . Cystoscopy w/ retrogrades Bilateral 07/13/2015    Procedure: CYSTOSCOPY WITH BILATERAL RETROGRADE PYELOGRAM;  Surgeon: Cleon Gustin, MD;  Location: [facility];  Service: Urology;  Laterality: Bilateral;    There were no vitals filed for this visit.  Visit Diagnosis:  Pelvic floor dysfunction - Plan: PT plan of care cert/re-cert  Weakness of both hips - Plan: PT plan of  care cert/re-cert  Pelvic floor weakness in female - Plan: PT plan of care cert/re-cert      Subjective Assessment - 08/25/15 0856    Subjective 1 year ago has had increased difficulty with bowel movement.  Patient is taken herbs.  Patient has nuggets with water.  Coler is light brown. 1 bowel movement per day. Does not feel she fully evacuates the bowels.  alto fo bowels will settle above the baldder.  Patient has to get up every 1-2 hours to urinate at night.  During the day  patient goes to her bathroom every hour.    Patient Stated Goals have more full bowel movements.    Currently in Pain? No/denies            [facility] PT Assessment - 08/25/15 0001    Assessment   Medical Diagnosis Chronic Idiopathic constipation   Referring Provider Dr. Stark Klein   Onset Date/Surgical Date 07/28/14   Prior Therapy None   Precautions   Precautions None   Balance Screen   Has the patient fallen in the past 6 months No   Has the patient had a decrease in activity level because of a fear of falling?  No   Is the patient reluctant to leave their home because of a fear of falling?  No   Prior Function   Level of Independence Independent   Vocation Full time employment   Vocation Requirements sitting at computer   Leisure walks 1 hour  Cognition   Overall Cognitive Status Within Functional Limits for tasks assessed   Observation/Other Assessments   Focus on Therapeutic Outcomes (FOTO)  53% limitation for bowel survey  goal is 40% limitation   Posture/Postural Control   Posture/Postural Control Postural limitations   Postural Limitations Increased thoracic kyphosis   ROM / Strength   AROM / PROM / Strength AROM;Strength   AROM   Lumbar Flexion decreased  25%   Lumbar Extension decreased by 50%   Lumbar - Right Side Bend full   Lumbar - Left Side Bend full   PROM   Right Hip External Rotation  35   Left Hip External Rotation  50   Strength   Right Hip Extension 3+/5   Right Hip  ABduction 3+/5   Left Hip Extension 3+/5   Left Hip ABduction 3+/5   Palpation   Palpation comment tenderness in right diaphgram; tightness in lower abdominal'                 Pelvic Floor Special Questions - 08/25/15 0001    Prior Pregnancies Yes   Number of Pregnancies 2   Number of Vaginal Deliveries 2   Currently Sexually Active No   Urinary Leakage No   Urinary frequency every hour   Fecal incontinence No   Fluid intake 8 glasses of water per day   Caffeine beverages 1 cup of coffee in morning   Falling out feeling (prolapse) Yes   Activities that cause feeling of prolapse rectocele  diagnosed 2 years ago   Skin Integrity Other   Skin Integrity other increased coloration   Pelvic Floor Internal Exam Patient confirmed identification and approved PT to assess muscle integrity   Exam Type Rectal   Palpation when patient bears down she contracts the anal area; when patient does a pelvic floor contraction she will bear down andhold her breath   Strength Flicker   Tone increased tone                   PT Education - 08/25/15 0933    Education provided Yes   Education Details toileting technique; abdominal massage; diaphragmatic breathing   Person(s) Educated Patient   Methods Explanation;Demonstration;Verbal cues;Handout   Comprehension Verbal cues required;Returned demonstration;Verbalized understanding          PT Short Term Goals - 08/25/15 1122    PT SHORT TERM GOAL #1   Title understanding how to toilet correctly with relaxtion of pelvic lfoor   Time 4   Period Weeks   Status New   PT SHORT TERM GOAL #2   Title understand abdominal massage to facilitae the bowels to go through the intestines   Time 4   Period Weeks   Status New   PT SHORT TERM GOAL #3   Title understand correct bowel health to improve bowel movements   Time 4   Period Weeks   Status New   PT SHORT TERM GOAL #4   Title independent with flexiblity exercise and  diaphragmatic breathing   Time 4   Period Weeks   Status New           PT Long Term Goals - 08/25/15 1124    PT LONG TERM GOAL #1   Title indpendent with HEP and understands how to progress herself   Time 8   Period Weeks   Status New   PT LONG TERM GOAL #2   Title feels like she is able to empty her bowels  75% of the time due to ability to relax pelvic floor   Time 8   Period Weeks   Status New   PT LONG TERM GOAL #3   Title pelvic floor strength is 4/5 so she is able to push the bowels out.    Time 8   Period Weeks   Status New   PT LONG TERM GOAL #4   Title bilateral hip strength is 4/5 so she is able to expel the bowels with great ease   Time 8   Period Weeks   Status New               Plan - 08-31-2015 1011    Clinical Impression Statement Patient is a 26 yer old female with diagnosis of chronic idiopathic constipation for many years.  Patient reports her constipation has been gettig worse in the past year.  FOTO score is 53% limitation with Bowel survey. Patient reports no pain. Patient reports she has to urinate every hour. Patient reports no urinary or fecla leakage.  Patient has one bowel movement per day and it is just a nugget.  Patient does not feel  she has fully evacuated her bowels.  Pelvic floor strength is 1/5.  Patient will bear down when she is trying to contract her pelvic floor.  Patient will tighten her pelvic floor when trying to bear down.  Patient has increased tone of pelvic floor. Patient has difficulty with diaphragmatic breathing. Bil. hip extension and abduction is 3+/5.  Bil. hip external rotaiton is limited.  Patient is of low complexity. Patient would benefit from physical therapy to improve plevic floor function and relaxation.    Pt will benefit from skilled therapeutic intervention in order to improve on the following deficits Increased fascial restricitons;Decreased coordination;Decreased activity tolerance;Decreased endurance;Decreased  range of motion;Decreased strength;Impaired flexibility   Rehab Potential Excellent   Clinical Impairments Affecting Rehab Potential None   PT Frequency 1x / week   PT Duration 8 weeks   PT Treatment/Interventions ADLs/Self Care Home Management;Biofeedback;Electrical Stimulation;Functional mobility training;Therapeutic activities;Therapeutic exercise;Patient/family education;Neuromuscular re-education;Manual techniques;Passive range of motion   PT Next Visit Plan flexibility exericses pellvic floor emg   PT Home Exercise Plan fleibility exericses   Recommended Other Services None   Consulted and Agree with Plan of Care Patient          G-Codes - 2015/08/31 0850    Functional Assessment Tool Used FOTO score is 53% limitation   Functional Limitation Other PT primary   Other PT Primary Current Status [id number]) At least 40 percent but less than 60 percent impaired, limited or restricted   Other PT Primary Goal Status [id number]) At least 40 percent but less than 60 percent impaired, limited or restricted       Problem List There are no active problems to display for this patient.   Earlie Counts, PT August 31, 2015 11:32 AM   [facility] [address] [place], [place], [address] Phone: [phone]   Fax:  [phone]  Name: Jaime Weber MRN: [id number] Date of Birth: 09-22-1940

## 2015-08-26 ENCOUNTER — Other Ambulatory Visit: Payer: Self-pay | Admitting: General Surgery

## 2015-09-01 ENCOUNTER — Encounter: Payer: Self-pay | Admitting: Physical Therapy

## 2015-09-01 ENCOUNTER — Ambulatory Visit: Payer: Medicare Other | Attending: General Surgery | Admitting: Physical Therapy

## 2015-09-01 DIAGNOSIS — N8189 Other female genital prolapse: Secondary | ICD-10-CM | POA: Diagnosis present

## 2015-09-01 DIAGNOSIS — R29898 Other symptoms and signs involving the musculoskeletal system: Secondary | ICD-10-CM

## 2015-09-01 DIAGNOSIS — N8184 Pelvic muscle wasting: Secondary | ICD-10-CM | POA: Diagnosis present

## 2015-09-01 DIAGNOSIS — M6289 Other specified disorders of muscle: Secondary | ICD-10-CM | POA: Insufficient documentation

## 2015-09-01 NOTE — Patient Instructions (Addendum)
Supine Knee-to-Chest, Unilateral    Lie on back, hands clasped behind one knee. Pull knee in toward chest until a comfortable stretch is felt in lower back and buttocks. Hold _15__ seconds.  Repeat __1_ times per session. Do _1__ sessions per day.  Copyright  VHI. All rights reserved.   Supine Knee-to-Chest, Bilateral    Lie on back, hands clasped behind both knees. Pull knees in toward chest until a comfortable stretch is felt in lower back and buttocks. Hold _30__ seconds. Repeat __1_ times per session. Do _1__ sessions per day.  Copyright  VHI. All rights reserved.  Supine With Rotation    Lie, back flat, legs bent, feet together. Rotate knees to one side. Turn head to opposite side.Place arms out to side. Hold _30__ seconds. Repeat to other side. Repeat _1__ times per session. Do _1__ sessions per day.  Copyright  VHI. All rights reserved.  Cat Back    Kneel on hands and knees. Tuck chin and tighten stomach. Exhale and round back upward. Inhale and arch back downward. Hold each position _1__ seconds. Repeat _10__ times per session. Do __1_ sessions per day.  Copyright  VHI. All rights reserved.  Mid-Back Stretch    Push chest toward floor, reaching forward as far as possible. Hold _30___ seconds. Repeat __1_ times per set. Do __1__ sets per session. Do _1___ sessions per day.  http://orth.exer.us/130   Copyright  VHI. All rights reserved.  Piriformis Stretch, Sitting    Sit, one ankle on opposite knee, same-side hand on crossed knee. Push down on knee, keeping spine straight. Lean torso forward, with flat back, until tension is felt in hamstrings and gluteals of crossed-leg side. Hold _30__ seconds.  Repeat _1__ times per session. Do _1__ sessions per day.  Copyright  VHI. All rights reserved.  Chair Sitting    Sit at edge of seat, spine straight, one leg extended. Put a hand on each thigh and bend forward from the hip, keeping spine straight. Allow  hand on extended leg to reach toward toes. Support upper body with other arm. Hold _30__ seconds. Repeat _1__ times per session. Do _1__ sessions per day.  Copyright  VHI. All rights reserved.      [facility] [address] [place], [place] [address] Phone # [phone] Fax [phone] Bear Down    Exhaling, bear down as if to have a bowel movement. For 5 sec.  Repeat _5__ times. Do _3__ times a day.  Copyright  VHI. All rights reserved.  Supine    Lie flat with pillow support. Allow body's muscles to relax. Place hands on belly. Inhale slowly and deeply for _3__ seconds, so hands move up. Then take _3__ seconds to exhale. 5 min 1 time per day to relax Copyright  VHI. All rights reserved.

## 2015-09-01 NOTE — Therapy (Signed)
[facility] [address] [place], [place], [address] Phone: [phone]   Fax:  [phone]  Physical Therapy Treatment  Patient Details  Name: Jaime Weber MRN: [id number] Date of Birth: 10-Feb-1941 Referring Provider: Dr. Stark Klein  Encounter Date: 09/01/2015      PT End of Session - 09/01/15 0933    Visit Number 2   Number of Visits 10  Medicare   Date for PT Re-Evaluation 10/20/15   Authorization Type g-code 10the vistit; kx mocifier after 15    PT Start Time 0930   PT Stop Time 1010   PT Time Calculation (min) 40 min   Activity Tolerance Treatment limited secondary to medical complications (Comment)   Behavior During Therapy [facility] for tasks assessed/performed      Past Medical History  Diagnosis Date  . Lesion of bladder   . PONV (postoperative nausea and vomiting)   . Constipation   . GERD (gastroesophageal reflux disease)   . Borderline glaucoma   . Wears glasses     Past Surgical History  Procedure Laterality Date  . Orif radial fracture  07/05/2011    Procedure: OPEN REDUCTION INTERNAL FIXATION (ORIF) RADIAL FRACTURE;  Surgeon: Cammie Sickle., MD;  Location: [place];  Service: Orthopedics;  Laterality: Left;  . Vaginal hysterectomy  1980's  . Tonsillectomy  as child  . Cystoscopy with biopsy N/A 07/13/2015    Procedure: CYSTOSCOPY WITH BIOPSY WITH SELECTIVE CYTOLOGIES;  Surgeon: Cleon Gustin, MD;  Location: [facility];  Service: Urology;  Laterality: N/A;  . Cystoscopy w/ retrogrades Bilateral 07/13/2015    Procedure: CYSTOSCOPY WITH BILATERAL RETROGRADE PYELOGRAM;  Surgeon: Cleon Gustin, MD;  Location: [facility];  Service: Urology;  Laterality: Bilateral;    There were no vitals filed for this visit.  Visit Diagnosis:  Pelvic floor dysfunction  Weakness of both hips  Pelvic floor weakness in female      Subjective  Assessment - 09/01/15 0933    Subjective I feel better.  I have been having more movements.  I had a good bowel movement this morning. I got one of the squatty pottys.  Hard to get used to. Next thursday I see the GI doctor.  I am getting up more at night to urinate.    Patient Stated Goals have more full bowel movements.                       Pelvic Floor Special Questions - 09/01/15 0001    Biofeedback sidely resting level 40uv, supine resting level is 11 uv. Diaphragmatic beathing to practice reducing the pelvic floor resting level for evacuation of bowels.    Biofeedback sensor type Surface  surface around anus   Biofeedback Activity Quick contraction;10 second hold;Other;Bulging  sitting bulging pelvic floor; supine relaxation breathing                   PT Education - 09/01/15 0951    Education provided Yes   Education Details flexibility exercises; relaxation exercises   Person(s) Educated Patient   Methods Explanation;Demonstration;Verbal cues;Handout   Comprehension Returned demonstration;Verbalized understanding          PT Short Term Goals - 09/01/15 0936    PT SHORT TERM GOAL #1   Title understanding how to toilet correctly with relaxtion of pelvic lfoor   Time 4   Period Weeks   Status On-going  PT SHORT TERM GOAL #2   Title understand abdominal massage to facilitae the bowels to go through the intestines   Time 4   Period Weeks   Status On-going   PT SHORT TERM GOAL #3   Title understand correct bowel health to improve bowel movements   Time 4   Period Weeks   Status Achieved   PT SHORT TERM GOAL #4   Title independent with flexiblity exercise and diaphragmatic breathing   Time 4   Period Weeks   Status On-going           PT Long Term Goals - 08/25/15 1124    PT LONG TERM GOAL #1   Title indpendent with HEP and understands how to progress herself   Time 8   Period Weeks   Status New   PT LONG TERM GOAL #2   Title  feels like she is able to empty her bowels 75% of the time due to ability to relax pelvic floor   Time 8   Period Weeks   Status New   PT LONG TERM GOAL #3   Title pelvic floor strength is 4/5 so she is able to push the bowels out.    Time 8   Period Weeks   Status New   PT LONG TERM GOAL #4   Title bilateral hip strength is 4/5 so she is able to expel the bowels with great ease   Time 8   Period Weeks   Status New               Plan - 09/01/15 1024    Clinical Impression Statement Patient is a 75 year old female with diagnosis of chronic idiopathic constipation for many years.  Patient has been able to fully evacuate her bowels this morning for first time and using the squatty potty stool.  During pelvic floor EMG patient would contract the pelvic floor instead of relaxation while bearing down.  Patient requires verbal cues and tactle to do diaphgramatic breathing.  In beginning of treatment patient resting tone is 40 uv and at end of treatment was 11 uv.  Patient would benefit from physical therapy to reduce pelvic floor tone to be able to evacuate the bowels.    Pt will benefit from skilled therapeutic intervention in order to improve on the following deficits Increased fascial restricitons;Decreased coordination;Decreased activity tolerance;Decreased endurance;Decreased range of motion;Decreased strength;Impaired flexibility   Rehab Potential Excellent   Clinical Impairments Affecting Rehab Potential None   PT Frequency 1x / week   PT Duration 8 weeks   PT Treatment/Interventions ADLs/Self Care Home Management;Biofeedback;Electrical Stimulation;Functional mobility training;Therapeutic activities;Therapeutic exercise;Patient/family education;Neuromuscular re-education;Manual techniques;Passive range of motion   PT Next Visit Plan flexibility exericses, squatting, bowel health   PT Home Exercise Plan progress as needed   Consulted and Agree with Plan of Care Patient         Problem List There are no active problems to display for this patient.   Earlie Counts, PT 09/01/2015 10:29 AM    [facility] [address], [place], [place], [address] Phone: [phone]   Fax:  [phone]  Name: Jaime Weber MRN: [id number] Date of Birth: 06-Mar-1941

## 2015-09-03 ENCOUNTER — Ambulatory Visit (INDEPENDENT_AMBULATORY_CARE_PROVIDER_SITE_OTHER): Payer: Medicare Other | Admitting: Internal Medicine

## 2015-09-10 ENCOUNTER — Encounter: Payer: Self-pay | Admitting: Internal Medicine

## 2015-09-10 ENCOUNTER — Ambulatory Visit (INDEPENDENT_AMBULATORY_CARE_PROVIDER_SITE_OTHER): Payer: Medicare Other | Admitting: Internal Medicine

## 2015-09-10 ENCOUNTER — Encounter: Payer: Medicare Other | Admitting: Physical Therapy

## 2015-09-10 VITALS — BP 130/78 | HR 68 | Ht 66.0 in | Wt 164.4 lb

## 2015-09-10 DIAGNOSIS — N816 Rectocele: Secondary | ICD-10-CM | POA: Diagnosis not present

## 2015-09-10 DIAGNOSIS — N8184 Pelvic muscle wasting: Secondary | ICD-10-CM | POA: Diagnosis not present

## 2015-09-10 DIAGNOSIS — K5909 Other constipation: Secondary | ICD-10-CM

## 2015-09-10 DIAGNOSIS — K5900 Constipation, unspecified: Secondary | ICD-10-CM

## 2015-09-10 DIAGNOSIS — M6289 Other specified disorders of muscle: Secondary | ICD-10-CM

## 2015-09-10 NOTE — Progress Notes (Signed)
  Referred by: Bjorn Loser, MD  Subjective:    Patient ID: Jaime Weber, female    DOB: August 21, 1940, 75 y.o.   MRN: [id number] Cc: constipation HPI The patient is a very nice 75 year old divorced white woman who has about a 2 year history of worsening constipation. She had a colonoscopy in [place] that was negative back then. She has had increasing difficulty with producing an effective bowel movement, i.e. incomplete defecation and has decreased urge. She has a known rectocele. She has been to physical therapy and has been diagnosed with pelvic floor dysfunction and is undergoing therapy which is helping her. Her stool consistency is what she describes as chips and brown water. She takes herbal laxatives at night that produce a bowel movement though again incomplete defecation. In the past she has used Dulcolax MiraLAX and Linzess separately but they have not done any better than her current regimen. No bleeding no bloating or gaseousness. Minimal abdominal pain symptoms. She does have nocturia which is a problem. She had some sort of I bladder work up her biopsies and apparently returned okay. This is on her mind quite a bit and is stressing her. He does eat vegetables. She walks 5 miles a day total in her activities and with intentional walking or exercise. 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 One cup of coffee a day. Medications, allergies, past medical history, past surgical history, family history and social history are reviewed and updated in the EMR.   Review of Systems As per history of present illness. Some anxiety some insomnia some mild pedal edema. All other review of systems are negative.    Objective:   Physical Exam @BP  130/78 mmHg  Pulse 68  Ht 5\' 6"  (1.676 m)  Wt 164 lb 6 oz (74.56 kg)  BMI 26.54 kg/m2@  General:  Well-developed, well-nourished and in no acute  distress Eyes:  anicteric. Lungs: Clear to auscultation bilaterally. Heart:  S1S2, no rubs, murmurs, gallops. Abdomen:  soft, non-tender, no hepatosplenomegaly, hernia, or mass and BS+.  Rectal: Patti [place], [place] present.  NL anoderm Sl reduced resting tone Mod rectocele No mass and nontender  Voluntary squeeze request resulted in exaggerated rectal descent 2 x  Extremities:   no edema, cyanosis or clubbing Neuro:  A&O x 3.  Psych:  appropriate mood and  Affect.   Data Reviewed: PT notes [facility] surgery notes - Dr. Barry Dienes 08/24/15     Assessment & Plan:  Chronic constipation  Pelvic floor dysfunction  Rectocele - not symptomatic  She will continue her pelvic floor therapy. Continue current laxative regimen and try to add fiber in the form of Benefiber 2 tablespoons daily. She could also add MiraLAX. Hopefully she will have improved defecation between the pelvic floor physical therapy and that. I don't think, I have anything else to offer her now that she is already doing the things I would have recommended.   I appreciate the opportunity to care for this patient. CC: Talmage Coin, MD Dr. Nicki Reaper MacDiarmid Dr. Omer Jack, PT

## 2015-09-10 NOTE — Patient Instructions (Signed)
  Please use benefiber , handout provided.    Continue your physical therapy with Jaime Weber.    I appreciate the opportunity to care for you. Silvano Rusk, MD, [facility]

## 2015-09-11 DIAGNOSIS — N816 Rectocele: Secondary | ICD-10-CM | POA: Insufficient documentation

## 2015-09-11 DIAGNOSIS — M6289 Other specified disorders of muscle: Secondary | ICD-10-CM | POA: Insufficient documentation

## 2015-09-11 DIAGNOSIS — K5909 Other constipation: Secondary | ICD-10-CM | POA: Insufficient documentation

## 2015-09-15 ENCOUNTER — Encounter: Payer: Self-pay | Admitting: Physical Therapy

## 2015-09-15 ENCOUNTER — Ambulatory Visit: Payer: Medicare Other | Admitting: Physical Therapy

## 2015-09-15 DIAGNOSIS — R29898 Other symptoms and signs involving the musculoskeletal system: Secondary | ICD-10-CM

## 2015-09-15 DIAGNOSIS — N8184 Pelvic muscle wasting: Secondary | ICD-10-CM | POA: Diagnosis not present

## 2015-09-15 DIAGNOSIS — N8189 Other female genital prolapse: Secondary | ICD-10-CM

## 2015-09-15 DIAGNOSIS — M6289 Other specified disorders of muscle: Secondary | ICD-10-CM

## 2015-09-15 NOTE — Therapy (Signed)
[facility] [address], [place], [place], [address] Phone: [phone]   Fax:  [phone]  Physical Therapy Treatment  Patient Details  Name: Jaime Weber MRN: [id number] Date of Birth: 15-Aug-1940 Referring Provider: Dr. Stark Klein  Encounter Date: 09/15/2015      PT End of Session - 09/15/15 1110    Visit Number 3   Number of Visits 10  medicare   Date for PT Re-Evaluation 10/20/15   Authorization Type g-code 10the vistit; kx mocifier after 15    PT Start Time 1105   PT Stop Time 1145   PT Time Calculation (min) 40 min   Activity Tolerance Patient tolerated treatment well   Behavior During Therapy [facility] for tasks assessed/performed      Past Medical History  Diagnosis Date  . Lesion of bladder   . PONV (postoperative nausea and vomiting)   . Constipation   . Borderline glaucoma   . Wears glasses   . Rectocele     Past Surgical History  Procedure Laterality Date  . Orif radial fracture  07/05/2011    Procedure: OPEN REDUCTION INTERNAL FIXATION (ORIF) RADIAL FRACTURE;  Surgeon: Cammie Sickle., MD;  Location: [place];  Service: Orthopedics;  Laterality: Left;  . Vaginal hysterectomy  1980's  . Tonsillectomy  as child  . Cystoscopy with biopsy N/A 07/13/2015    Procedure: CYSTOSCOPY WITH BIOPSY WITH SELECTIVE CYTOLOGIES;  Surgeon: Cleon Gustin, MD;  Location: [facility];  Service: Urology;  Laterality: N/A;  . Cystoscopy w/ retrogrades Bilateral 07/13/2015    Procedure: CYSTOSCOPY WITH BILATERAL RETROGRADE PYELOGRAM;  Surgeon: Cleon Gustin, MD;  Location: [facility];  Service: Urology;  Laterality: Bilateral;  . Colonoscopy  2014    [place], negative per patient    There were no vitals filed for this visit.  Visit Diagnosis:  Pelvic floor dysfunction  Weakness of both hips  Pelvic floor weakness in female      Subjective  Assessment - 09/15/15 1110    Subjective I had a good weekend.  I go to the bathroom every hour.     Patient Stated Goals have more full bowel movements.    Currently in Pain? No/denies            [facility] PT Assessment - 09/15/15 0001    Strength   Right Hip Extension 3+/5   Right Hip ABduction 3+/5   Left Hip Extension 3+/5   Left Hip ABduction 3+/5                     [facility] Adult PT Treatment/Exercise - 09/15/15 0001    Self-Care   Self-Care Other Self-Care Comments   Other Self-Care Comments  urge to void; Relaxation technique   Manual Therapy   Manual Therapy Soft tissue mobilization   Soft tissue mobilization to abdominal area to promote bowel movement                 PT Education - 09/15/15 1131    Education provided Yes   Education Details urge to void; hip strengthening, relaxation website.    Person(s) Educated Patient   Methods Explanation;Demonstration;Verbal cues;Handout   Comprehension Returned demonstration;Verbalized understanding          PT Short Term Goals - 09/15/15 1145    PT SHORT TERM GOAL #1   Title understanding how to toilet correctly with relaxtion of pelvic lfoor  Time 4   Period Weeks   Status Achieved   PT SHORT TERM GOAL #2   Title understand abdominal massage to facilitae the bowels to go through the intestines   Time 4   Period Weeks   Status Achieved   PT SHORT TERM GOAL #3   Title understand correct bowel health to improve bowel movements   Time 4   Period Weeks   Status Achieved   PT SHORT TERM GOAL #4   Title independent with flexiblity exercise and diaphragmatic breathing   Time 4   Period Weeks   Status Achieved           PT Long Term Goals - 08/25/15 1124    PT LONG TERM GOAL #1   Title indpendent with HEP and understands how to progress herself   Time 8   Period Weeks   Status New   PT LONG TERM GOAL #2   Title feels like she is able to empty her bowels 75% of the time due to ability  to relax pelvic floor   Time 8   Period Weeks   Status New   PT LONG TERM GOAL #3   Title pelvic floor strength is 4/5 so she is able to push the bowels out.    Time 8   Period Weeks   Status New   PT LONG TERM GOAL #4   Title bilateral hip strength is 4/5 so she is able to expel the bowels with great ease   Time 8   Period Weeks   Status New               Plan - 09/15/15 1145    Clinical Impression Statement Patient is a 75 year old female with diagnosis of chronic constipation.  Patient reports over the weekend she was able to have full boewl movements and easier to push them out.  Patient reports she has not drank enough water  today therefore has some dificulty with bowel movement.  Patient has met her STG's.  Patient will bnefit from physical therapy to imporve pelvic floor relaxation and decrease the number of tmes ot go to the bathroom at night.    Pt will benefit from skilled therapeutic intervention in order to improve on the following deficits Increased fascial restricitons;Decreased coordination;Decreased activity tolerance;Decreased endurance;Decreased range of motion;Decreased strength;Impaired flexibility   Rehab Potential Excellent   Clinical Impairments Affecting Rehab Potential None   PT Frequency 1x / week   PT Duration 8 weeks   PT Treatment/Interventions ADLs/Self Care Home Management;Biofeedback;Electrical Stimulation;Functional mobility training;Therapeutic activities;Therapeutic exercise;Patient/family education;Neuromuscular re-education;Manual techniques;Passive range of motion   PT Next Visit Plan bathroom at night, pelvic floor relaxation with bowel movement   PT Home Exercise Plan progress as needed   Consulted and Agree with Plan of Care Patient        Problem List Patient Active Problem List   Diagnosis Date Noted  . Rectocele 09/11/2015  . Chronic constipation 09/11/2015  . Pelvic floor dysfunction 09/11/2015    Earlie Counts,  PT 09/15/2015 11:49 AM    [facility] [address], [place], [place], [address] Phone: [phone]   Fax:  [phone]  Name: Jaime Weber MRN: [id number] Date of Birth: 1940-08-06

## 2015-09-15 NOTE — Patient Instructions (Addendum)
Relaxation Exercises with the Urge to Void   When you experience an urge to void: At night  FIRST  Stop and stand very still    Sit down if you can    Don't move    You need to stay very still to maintain control  SECOND Squeeze your pelvic floor muscles 5 times, like a quick flick, to keep from leaking  THIRD Relax  Take a deep breath and then let it out  Try to make the urge go away by using relaxation and visualization techniques  FINALLY When you feel the urge go away somewhat, go back to sleep.   If the urge gets suddenly stronger on the way, you may stop again and relax to regain control.  http://carolynmcmanus.com/guided-meditations-free-downloads/ is the website for the download of medictation.   Strengthening: Hip Abduction (Side-Lying)    Tighten muscles on front of left thigh, then lift leg __6__ inches from surface, keeping knee locked.  Repeat _10___ times per set. Do _2___ sets per session. Do _1___ sessions per day. Both sides.  http://orth.exer.us/622   Copyright  VHI. All rights reserved.    Strengthening: Hip Extension (Prone)   Place pillow under stomach. Tighten muscles on front of left thigh, then lift leg _3___ inches from surface, keeping knee locked. Repeat _10___ times per set. Do _2___ sets per session. Do 1____ sessions per day.  http://orth.exer.us/620   Copyright  VHI. All rights reserved.   [facility] [address] [place], [place] [address] Phone # [phone] Fax [phone]

## 2015-09-22 ENCOUNTER — Encounter: Payer: Medicare Other | Admitting: Physical Therapy

## 2015-09-29 ENCOUNTER — Encounter: Payer: Medicare Other | Admitting: Physical Therapy

## 2015-10-07 ENCOUNTER — Ambulatory Visit: Payer: No Typology Code available for payment source | Admitting: Podiatry

## 2015-10-13 ENCOUNTER — Encounter: Payer: Self-pay | Admitting: Physical Therapy

## 2015-10-13 ENCOUNTER — Ambulatory Visit: Payer: Medicare Other | Attending: General Surgery | Admitting: Physical Therapy

## 2015-10-13 DIAGNOSIS — M6281 Muscle weakness (generalized): Secondary | ICD-10-CM | POA: Insufficient documentation

## 2015-10-13 NOTE — Patient Instructions (Addendum)
Introduction to Fiber Fiber Overview Dietary fiber is the part of plants that can't be digested. There are 2 kinds of dietary fiber: insoluble and soluble.  Insoluble fiber adds bulk to keep foods moving through the digestive system. Soluble fiber holds water, which softens the stool for easy bowel movements. Fiber is an important part of your diet, even though it passes through your body undigested and has no nutritional value. A high fiber diet can:  . promote regular bowel movements  . treat diverticular disease (inflammation of part of the intestine) and irritable bowel syndrome (abdominal pain, diarrhea, and constipation that come and go) . promote improvement in hemorrhoids, constipation and fecal incontinence  You should have at least 14 grams of fiber for every 1000 calories you eat every day. Read the label on every food package to find out how much fiber a serving of the food will provide. Foods containing more than 20% of the daily value of fiber per serving are considered high in fiber.  Without enough fiber in your diet, you may suffer from:  . constipation  . small, hard, dry bowel movements.   Sources of Fiber Breads, cereals, and pasta made with whole grain flour, and brown rice are high fiber foods. Many breakfast cereals list the bran or fiber content, so it's easy to know which products are high in fiber.  All fruits and vegetables also contain fiber. Dried beans, leafy vegetables, peas, raisins, prunes, apples, and citrus fruits are all especially good sources of fiber. Ask for examples of high-fiber foods (the fiber table and types of fiber handouts) for more resources on fiber.  Additional information on fiber content in foods, is available at www.caloriecounts.com Types of Fiber  There are two main types of fiber:  insoluble and soluble.  Both of these types can prevent and relieve constipation and diarrhea, although some people find one or the other to be more easily  digested.  This handout details information about both types of fiber.  Insoluble Fiber       Functions of Insoluble Fiber . moves bulk through the intestines  . controls and balances the pH (acidity) in the intestines       Benefits of Insoluble Fiber . promotes regular bowel movement and prevents constipation  . removes fecal waste through colon in less time  . keeps an optimal pH in intestines to prevent microbes from producing cancer substances, therefore preventing colon cancer        Food Sources of Insoluble Fiber . whole-wheat products  . wheat bran "miller's bran" . corn bran  . flax seed or other seeds . vegetables such as green beans, broccoli, cauliflower and potato skins  . fruit skins and root vegetable skins  . popcorn . brown rice  Soluble Fiber       Functions of Soluble Fiber  . holds water in the colon to bulk and soften the stool . prolongs stomach emptying time so that sugar is released and absorbed more slowly        Benefits of Soluble Fiber . lowers total cholesterol and LDL cholesterol (the bad cholesterol) therefore reducing the risk of heart disease  . regulates blood sugar for people with diabetes       Food Sources of Soluble Fiber . oat/oat bran . dried beans and peas  . nuts  . barley  . flax seed or other seeds . fruits such as oranges, pears, peaches, and apples  . vegetables such as carrots  .  psyllium husk  . Prunes  Adding Fiber to Your Diet Eating foods that are high in fiber can help relieve some problems with constipation, hemorrhoids, diverticulitis and irritable bowel syndrome. A high-fiber diet can provide long-term health benefits.  Here are some tips to help you easily add fibers to your diet.  Start Slowly Many people notice bloating, cramping or gas when they add fiber to their diet. Making small changes in your diet over a period of time can help prevent this. Start with one of the changes listed below, then wait several  days to a week before making another. If one change doesn't seem to work for you, try a different one. You may have some gas or bloating at first, but your body will adjust in time. It's important to drink more fluids when you increase the amount of fiber you eat. If you don't already drink over 6 glasses of liquid a day, drink at least 2 more glasses of water a day when you increase your fiber intake. Tips for Increasing Fiber . Start your day with a high-fiber breakfast cereal. Check labels on the packages for the amounts of dietary fiber in each brand.  Eat at least 5 servings of fruits and vegetables each day. Fruits and vegetables that are high in fiber include: Apples Oranges Broccoli Cauliflower Berries Pears Brussels sprouts Green peas Figs Prunes Carrots Beans . Include fruits or vegetables with every meal. Use carrot sticks or apple slices for snacks.  . Cooked fiber is just as effective as raw fiber, so incorporate high-fiber foods in your cooking. . When preparing food leave edible skins and seeds, and use whole-grain flours. . Serve fruit-based desserts.  . Replace white bread with whole-grain breads and cereals. Eat brown rice instead of white rice.  . Eat more of the following foods: Bran muffins Brown rice Oatmeal Popcorn Multiple-grain cereals, cooked or dry 100% whole-wheat bread  . Add whole grains and dried beans to casseroles.  . Add 1/4 cup of wheat bran (miller's bran) to foods such as cooked cereal or applesauce or meat loaf. If you still suffer from constipation, talk to your health care provider about fiber laxatives. Psyllium is a soluble fiber that is often used for this purpose. It can be taken in tablet form or as a powder that is mixed in a glass of water. Always read and follow the directions on the label carefully.  Talk to your doctor or pharmacist if you have any concerns about fiber.  [facility] [address], [place], [place]  [address] Phone # [phone] Fax [phone]

## 2015-10-13 NOTE — Therapy (Addendum)
[facility] [address] [place], [place], [address] Phone: [phone]   Fax:  [phone]  Physical Therapy Treatment  Patient Details  Name: Jaime Weber MRN: [id number] Date of Birth: 22-Feb-1941 Referring Provider: Dr. Stark Klein  Encounter Date: 10/13/2015      PT End of Session - 10/13/15 1025    Visit Number 4   Number of Visits 10   Date for PT Re-Evaluation 12/08/15   Authorization Type g-code 10the vistit; kx mocifier after 15    PT Start Time 1022   PT Stop Time 1100   PT Time Calculation (min) 38 min   Activity Tolerance Patient tolerated treatment well   Behavior During Therapy [facility] for tasks assessed/performed      Past Medical History  Diagnosis Date  . Lesion of bladder   . PONV (postoperative nausea and vomiting)   . Constipation   . Borderline glaucoma   . Wears glasses   . Rectocele     Past Surgical History  Procedure Laterality Date  . Orif radial fracture  07/05/2011    Procedure: OPEN REDUCTION INTERNAL FIXATION (ORIF) RADIAL FRACTURE;  Surgeon: Cammie Sickle., MD;  Location: [facility];  Service: Orthopedics;  Laterality: Left;  . Vaginal hysterectomy  1980's  . Tonsillectomy  as child  . Cystoscopy with biopsy N/A 07/13/2015    Procedure: CYSTOSCOPY WITH BIOPSY WITH SELECTIVE CYTOLOGIES;  Surgeon: Cleon Gustin, MD;  Location: [facility];  Service: Urology;  Laterality: N/A;  . Cystoscopy w/ retrogrades Bilateral 07/13/2015    Procedure: CYSTOSCOPY WITH BILATERAL RETROGRADE PYELOGRAM;  Surgeon: Cleon Gustin, MD;  Location: [facility];  Service: Urology;  Laterality: Bilateral;  . Colonoscopy  2014    [place], negative per patient    There were no vitals filed for this visit.      Subjective Assessment - 10/13/15 1026    Subjective I drink enough water.  I am not eating right. I have not been having good bowel  movements. When I have a bowel movement it does not feel complete.  Bowels are watery.   Patient Stated Goals have more full bowel movements.    Currently in Pain? No/denies            [facility] PT Assessment - 10/13/15 0001    Assessment   Medical Diagnosis Chronic Idiopathic constipation   Onset Date/Surgical Date 07/28/14   Prior Therapy None   Precautions   Precautions None   Balance Screen   Has the patient fallen in the past 6 months No   Has the patient had a decrease in activity level because of a fear of falling?  No   Is the patient reluctant to leave their home because of a fear of falling?  No   Prior Function   Level of Independence Independent   Vocation Full time employment   Vocation Requirements sitting at computer   Leisure walks 1 hour   Cognition   Overall Cognitive Status Within Functional Limits for tasks assessed   Observation/Other Assessments   Focus on Therapeutic Outcomes (FOTO)  53% limitation for bowel survey  goal is 40% limitation   PROM   Right Hip External Rotation  50   Left Hip External Rotation  50   Strength   Right Hip Extension 3+/5   Right Hip ABduction 3+/5   Left Hip Extension 3+/5   Left Hip ABduction 3+/5  G-code: functional assessment tool used is therapist discretion due to patient being discharged due to feeling better; goal status is CK; discharge status is CJ. Earlie Counts, PT 11/09/2015 12:17 PM               Pelvic Floor Special Questions - 10/13/15 0001    Pelvic Floor Internal Exam Patient confirmed identification and approved PT to assess muscle integrity   Exam Type Rectal   Palpation when patient bears down she contracts the anal area; when patient does a pelvic floor contraction she will bear down andhold her breath   Strength Flicker           [facility] Adult PT Treatment/Exercise - 10/13/15 0001    Lumbar Exercises: Stretches   Active Hamstring Stretch 2 reps;30 seconds  bil, sit   Single Knee to  Chest Stretch 30 seconds;1 rep  bil   Double Knee to Chest Stretch 30 seconds;1 rep   Lower Trunk Rotation 30 seconds;3 reps  bil.    Piriformis Stretch 30 seconds;2 reps  bil., sit   Lumbar Exercises: Sidelying   Hip Abduction 10 reps  tactile cues with hip extended   Lumbar Exercises: Prone   Straight Leg Raise 10 reps  bil   Manual Therapy   Manual Therapy Soft tissue mobilization   Soft tissue mobilization to abdominal area to promote bowel movement                 PT Education - 10/13/15 1057    Education provided Yes   Education Details how to introduce fiber to her diet for better bowels   Person(s) Educated Patient   Methods Explanation;Handout   Comprehension Verbalized understanding          PT Short Term Goals - 09/15/15 1145    PT SHORT TERM GOAL #1   Title understanding how to toilet correctly with relaxtion of pelvic lfoor   Time 4   Period Weeks   Status Achieved   PT SHORT TERM GOAL #2   Title understand abdominal massage to facilitae the bowels to go through the intestines   Time 4   Period Weeks   Status Achieved   PT SHORT TERM GOAL #3   Title understand correct bowel health to improve bowel movements   Time 4   Period Weeks   Status Achieved   PT SHORT TERM GOAL #4   Title independent with flexiblity exercise and diaphragmatic breathing   Time 4   Period Weeks   Status Achieved           PT Long Term Goals - 10/13/15 1056    PT LONG TERM GOAL #1   Title indpendent with HEP and understands how to progress herself   Time 8   Period Weeks   Status New  still learning   PT LONG TERM GOAL #2   Title feels like she is able to empty her bowels 75% of the time due to ability to relax pelvic floor   Time 8   Period Weeks   Status On-going   PT LONG TERM GOAL #3   Title pelvic floor strength is 4/5 so she is able to push the bowels out.    Time 8   Period Weeks   Status On-going   PT LONG TERM GOAL #4   Title bilateral hip  strength is 4/5 so she is able to expel the bowels with great ease   Time 8   Period Weeks  Status On-going               Plan - 10/13/15 1058    Clinical Impression Statement Patient is a 75 year old female with diagnosis of chronic constipation.  Patient has not been having better bowel movements and they are watery.  Patient was educated on how to introdue insoluble fiber into her diet.  Patient has not been doing her HEP therefore does not comletely empty her bowels like she was. Patient has not met her goals due to not doing her HEP.  Bilateral hip abduction and extension strength si 3+/5. Patient will bnefit from physical therapy to improve bowels.    Rehab Potential Excellent   Clinical Impairments Affecting Rehab Potential None   PT Frequency 1x / week   PT Duration 8 weeks   PT Treatment/Interventions ADLs/Self Care Home Management;Biofeedback;Electrical Stimulation;Functional mobility training;Therapeutic activities;Therapeutic exercise;Patient/family education;Neuromuscular re-education;Manual techniques;Passive range of motion   PT Next Visit Plan bathroom at night, pelvic floor relaxation with bowel movement   PT Home Exercise Plan progress as needed   Consulted and Agree with Plan of Care Patient      Patient will benefit from skilled therapeutic intervention in order to improve the following deficits and impairments:  Increased fascial restricitons, Decreased coordination, Decreased activity tolerance, Decreased endurance, Decreased range of motion, Decreased strength, Impaired flexibility  Visit Diagnosis: Muscle weakness (generalized) - Plan: PT plan of care cert/re-cert     Problem List Patient Active Problem List   Diagnosis Date Noted  . Rectocele 09/11/2015  . Chronic constipation 09/11/2015  . Pelvic floor dysfunction 09/11/2015    Earlie Counts, PT 10/13/2015 11:05 AM   [facility] [address] [place], [place], [address] Phone: [phone]   Fax:  [phone]  Name: Jaime Weber MRN: [id number] Date of Birth: 1940/12/13  PHYSICAL THERAPY DISCHARGE SUMMARY  Visits from Start of Care: 4  Current functional level related to goals / functional outcomes: See above.  Patient called on 11/09/2015 to reports she wanted to cancel all appointments and be discharged.  She is feeling better.   Remaining deficits: See above.   Education / Equipment: HEP Plan: Patient agrees to discharge.  Patient goals were partially met. Patient is being discharged due to the patient's request.  Thank you for the referral. Earlie Counts, PT 11/09/2015 12:20 PM  ?????

## 2015-11-10 ENCOUNTER — Ambulatory Visit: Payer: Medicare Other | Admitting: Physical Therapy

## 2016-02-25 ENCOUNTER — Other Ambulatory Visit: Payer: Self-pay | Admitting: Neurosurgery

## 2016-02-25 DIAGNOSIS — M810 Age-related osteoporosis without current pathological fracture: Secondary | ICD-10-CM

## 2016-02-25 DIAGNOSIS — M8000XA Age-related osteoporosis with current pathological fracture, unspecified site, initial encounter for fracture: Secondary | ICD-10-CM

## 2016-02-25 DIAGNOSIS — Z1231 Encounter for screening mammogram for malignant neoplasm of breast: Secondary | ICD-10-CM

## 2016-03-03 ENCOUNTER — Other Ambulatory Visit: Payer: Medicare Other

## 2016-03-16 ENCOUNTER — Ambulatory Visit
Admission: RE | Admit: 2016-03-16 | Discharge: 2016-03-16 | Disposition: A | Discharge status: Home / Self Care | Payer: Medicare Other | Source: Ambulatory Visit | Attending: Neurosurgery | Admitting: Neurosurgery

## 2016-03-16 DIAGNOSIS — M810 Age-related osteoporosis without current pathological fracture: Secondary | ICD-10-CM

## 2016-03-16 DIAGNOSIS — Z1231 Encounter for screening mammogram for malignant neoplasm of breast: Secondary | ICD-10-CM

## 2016-06-22 ENCOUNTER — Ambulatory Visit: Payer: Medicare Other | Admitting: Podiatry

## 2016-06-23 ENCOUNTER — Other Ambulatory Visit: Payer: Self-pay | Admitting: Physician Assistant

## 2016-06-23 ENCOUNTER — Ambulatory Visit: Payer: No Typology Code available for payment source | Admitting: Podiatry

## 2016-06-23 ENCOUNTER — Encounter: Payer: Self-pay | Admitting: Podiatry

## 2016-06-23 ENCOUNTER — Ambulatory Visit (INDEPENDENT_AMBULATORY_CARE_PROVIDER_SITE_OTHER): Payer: Medicare Other | Admitting: Podiatry

## 2016-06-23 ENCOUNTER — Ambulatory Visit (INDEPENDENT_AMBULATORY_CARE_PROVIDER_SITE_OTHER): Payer: Medicare Other

## 2016-06-23 DIAGNOSIS — M21612 Bunion of left foot: Secondary | ICD-10-CM | POA: Diagnosis not present

## 2016-06-23 DIAGNOSIS — M2042 Other hammer toe(s) (acquired), left foot: Secondary | ICD-10-CM

## 2016-06-23 NOTE — Patient Instructions (Signed)

## 2016-06-23 NOTE — Progress Notes (Signed)
Subjective:     Patient ID: Jaime Weber, female   DOB: 1940/10/05, 75 y.o.   MRN: [id number]  HPI patient states she no she needs to get her bunion fixed on the left foot and the second toe as they have been increasingly tender and she's tried wider shoes padding without relief of her symptoms. 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 I did explain that she has soft bone and possibility for bone movement does occur and she understands this wants surgery and also understands all other risks as outlined in the consent form. Patient stands final recovery can take 6 months to one year signed consent form is given all preoperative instructions and also is dispensed cam walker with instructions on usage. Patient is scheduled for outpatient surgery [facility] specialty surgical center is encouraged to call with questions  Report indicate structural bunion deformity left with elevation of the intermetatarsal angle deviation of the hallux and rigid elevation second toe left with fracture proximal phalanx third toe left that is healing

## 2016-07-06 ENCOUNTER — Telehealth: Payer: Self-pay | Admitting: *Deleted

## 2016-07-06 NOTE — Telephone Encounter (Addendum)
"  I'm supposed to have surgery on Tuesday the 16.  [facility] has not called me back.  I was just wondering when they would call and let me know the time."

## 2016-07-07 NOTE — Telephone Encounter (Signed)
I am returning your call.  Your surgery is not going to be done at [facility]  It is going to be done at [facility].  "Where are they located?"  They are located at [place]. [employer].  You should have the brochure in your blue bag that has the information in it.  "When will they let me know what time to be there?"  They will call you tomorrow or Monday.

## 2016-07-12 ENCOUNTER — Encounter: Payer: Self-pay | Admitting: Podiatry

## 2016-07-12 DIAGNOSIS — M2042 Other hammer toe(s) (acquired), left foot: Secondary | ICD-10-CM | POA: Diagnosis not present

## 2016-07-12 DIAGNOSIS — M2012 Hallux valgus (acquired), left foot: Secondary | ICD-10-CM | POA: Diagnosis not present

## 2016-07-15 ENCOUNTER — Telehealth: Payer: Self-pay | Admitting: *Deleted

## 2016-07-15 NOTE — Telephone Encounter (Addendum)
Pt left name, DOB, phone number and DOS. I left message informing pt I would be in the office a little longer and would call before I left. I left a message informing pt I would call again before I left. I spoke with pt and she states it looks like the pin is coming out. I told pt that usually meant the pt had been up on the surgery foot more than 15 minutes/hour. Pt states she did not think she had. I told pt it could also be that her swelling is going down, but to keep the weightbearing down to 15 minutes per hour. Pt states understanding and asked if she could take a shower I told pt she could try but it may be difficult and that she shouldn't get the foot wet. Pt asked if she could get her hair done next week and I told her that would be fine but to try to keep the foot off the floor or from dangling. Pt states understanding. 07/25/2016-Pt states the nurse at the last visit said to wear the surgical shoe and she could take a shower. Dr. Paulla Dolly then came in and redressed the foot and put her back in the surgical boot, and said she could shower if she could keep the surgical dressing dry. I told pt that I had reviewed Dr. Paulla Dolly 07/15/2016 notes and he had wanted her to have the foot completely immobilized and that was what the surgical boot would do. Pt states the pin in the toe is coming out, now about the width of her thumb. I told her it would do that sometimes by itself, but also too much activity would cause it to back out.  I told pt to decrease activity to her acts of daily living and if the pin did come out cover the tip of the toe with an antibiotic ointment and bandaid and give [place] a call. Pt states understanding. Transferred pt to schedulers.08/24/2016-Pt states she had surgery 5 weeks ago, and is to begin transferring in to an athletic shoe on Friday, but a bone has come up under where her sutures were. I called pt, instructed her to make an appt.

## 2016-07-22 ENCOUNTER — Ambulatory Visit (INDEPENDENT_AMBULATORY_CARE_PROVIDER_SITE_OTHER): Payer: Medicare Other

## 2016-07-22 ENCOUNTER — Ambulatory Visit (INDEPENDENT_AMBULATORY_CARE_PROVIDER_SITE_OTHER): Payer: Self-pay | Admitting: Podiatry

## 2016-07-22 DIAGNOSIS — M2042 Other hammer toe(s) (acquired), left foot: Secondary | ICD-10-CM

## 2016-07-22 DIAGNOSIS — M21612 Bunion of left foot: Secondary | ICD-10-CM | POA: Diagnosis not present

## 2016-07-23 NOTE — Progress Notes (Signed)
Subjective:     Patient ID: [name], female   DOB: 12/30/1940, 76 y.o.   MRN: [id number]  HPI patient states she's doing well with surgery with mild discomfort and swelling if she's been on her foot to long   Review of Systems     Objective:   Physical Exam Neurovascular status intact negative Homans sign was noted with wound edges well coapted with hallux in relatively rectus position hallux moved over and mild elevation of the second toe but pin in place. Patient has good digital perfusion at this time    Assessment:     Forefoot reconstruction left that continues to exhibit swelling and pain but overall healing well    Plan:     H&P x-rays reviewed and continued complete immobilization of the area elevation and reappoint one week to reevaluate to make sure no further bone movement has occurred with her that she did have soft bone and has stress on the first metatarsal but I'm hopeful it'll heal in this position and I also did lower the second toe  X-ray report indicates that there has been some movement of the dorsal segment of the osteotomy site but I'm hopeful it will consolidate in this position and not giving any long-term problems. Convey to patient importance of complete immobilization of area

## 2016-07-25 NOTE — Progress Notes (Signed)
DOS 01.16.2018 Austin with fixation left foot. Fusion with pin 2nd toe left. Possible aiken osteotomy left.

## 2016-07-27 ENCOUNTER — Ambulatory Visit (INDEPENDENT_AMBULATORY_CARE_PROVIDER_SITE_OTHER): Payer: Medicare Other

## 2016-07-27 ENCOUNTER — Ambulatory Visit: Payer: Medicare Other

## 2016-07-27 ENCOUNTER — Ambulatory Visit (INDEPENDENT_AMBULATORY_CARE_PROVIDER_SITE_OTHER): Payer: Medicare Other | Admitting: Podiatry

## 2016-07-27 DIAGNOSIS — M21612 Bunion of left foot: Secondary | ICD-10-CM

## 2016-07-27 DIAGNOSIS — M2042 Other hammer toe(s) (acquired), left foot: Secondary | ICD-10-CM

## 2016-07-28 NOTE — Progress Notes (Signed)
Subjective:     Patient ID: Jaime Weber, female   DOB: [birth year], 76 y.o.   MRN: [id number]  HPI patient states she's doing really well with her left foot with minimal discomfort and able to walk without significant discomfort   Review of Systems     Objective:   Physical Exam Neurovascular status intact negative Homan sign was noted with patient second digit found to be moderately elevated in comparison to the big toe with good alignment of the big toe stitches intact and no drainage    Assessment:     Doing well post forefoot reconstruction left    Plan:     Stitches removed at this time and above ankle BioS[employer] brace was applied to control edema and also to lower the second toe with instructions given on proper lowering of the second toe. Patient will be seen back in 2 weeks for pin removal or earlier if needed and will continue immobilization elevation at this time  X-ray report indicates that the osteotomies are healing well pins in place with good alignment

## 2016-07-29 ENCOUNTER — Ambulatory Visit: Payer: Medicare Other | Admitting: Podiatry

## 2016-08-12 ENCOUNTER — Ambulatory Visit (INDEPENDENT_AMBULATORY_CARE_PROVIDER_SITE_OTHER): Payer: Medicare Other

## 2016-08-12 ENCOUNTER — Ambulatory Visit (INDEPENDENT_AMBULATORY_CARE_PROVIDER_SITE_OTHER): Payer: Medicare Other | Admitting: Podiatry

## 2016-08-12 DIAGNOSIS — M21612 Bunion of left foot: Secondary | ICD-10-CM

## 2016-08-12 DIAGNOSIS — M2042 Other hammer toe(s) (acquired), left foot: Secondary | ICD-10-CM | POA: Diagnosis not present

## 2016-08-13 NOTE — Progress Notes (Signed)
Subjective:     Patient ID: Jaime Weber, female   DOB: [birth year], 76 y.o.   MRN: [id number]  HPI patient states my left foot is improving quite a bit with mild swelling if I'm on it too long   Review of Systems     Objective:   Physical Exam Neurovascular status intact negative Homans sign noted well coapted incision site left first and second metatarsal with good alignment of the toe and slight elevation of the big toe left which is a normal variant this patient has    Assessment:     Doing well post forefoot reconstruction left    Plan:     Advised this patient on continuing to keep the second toe and hallux plantar flexed and gave her additional dressings to do this and advised her on gradual return soft shoe gear starting in the next 2 weeks. Patient will be seen back to recheck and will continue with elevation immobilization compression and see back earlier if any issues should occur  X-ray report indicates pins wires and screw in alignment with no indications of significant pathology

## 2016-08-24 ENCOUNTER — Ambulatory Visit (INDEPENDENT_AMBULATORY_CARE_PROVIDER_SITE_OTHER): Payer: Medicare Other | Admitting: Podiatry

## 2016-08-24 ENCOUNTER — Encounter: Payer: Self-pay | Admitting: Podiatry

## 2016-08-24 ENCOUNTER — Ambulatory Visit (INDEPENDENT_AMBULATORY_CARE_PROVIDER_SITE_OTHER): Payer: Medicare Other

## 2016-08-24 DIAGNOSIS — M2012 Hallux valgus (acquired), left foot: Secondary | ICD-10-CM

## 2016-08-24 DIAGNOSIS — Z9889 Other specified postprocedural states: Secondary | ICD-10-CM

## 2016-08-24 DIAGNOSIS — Z472 Encounter for removal of internal fixation device: Secondary | ICD-10-CM | POA: Diagnosis not present

## 2016-08-25 NOTE — Progress Notes (Signed)
Subjective:     Patient ID: Jaime Weber, female   DOB: [birth year], 75 y.o.   MRN: [id number]  HPI patient states this pain is prominent on my left foot and it's getting sore and I just wanted to have it checked   Review of Systems     Objective:   Physical Exam Neurovascular status intact negative Homans sign was noted with patient's first MPJ left being in good alignment with prominent pin position secondary to probable trauma    Assessment:     Abnormal proximal pin position left first metatarsal    Plan:     Discussed that this will need to be moved in future but I rather leave it in for another 4-6 weeks and patient be seen back we will discuss pin removal  X-ray indicates the proximal pin has moved and we'll need to be removed long-term

## 2016-09-09 ENCOUNTER — Ambulatory Visit: Payer: Medicare Other

## 2016-09-09 ENCOUNTER — Ambulatory Visit (INDEPENDENT_AMBULATORY_CARE_PROVIDER_SITE_OTHER): Payer: Self-pay | Admitting: Podiatry

## 2016-09-09 DIAGNOSIS — M21612 Bunion of left foot: Secondary | ICD-10-CM

## 2016-09-09 DIAGNOSIS — Z9889 Other specified postprocedural states: Secondary | ICD-10-CM

## 2016-09-09 DIAGNOSIS — M2042 Other hammer toe(s) (acquired), left foot: Secondary | ICD-10-CM

## 2016-09-11 NOTE — Progress Notes (Signed)
Subjective:     Patient ID: Jaime Weber, female   DOB: 16-Jun-1941, 76 y.o.   MRN: [id number]  HPI patient continues to have swelling in her foot and was just concerned about getting back into shoes and whether or not she is creating any damage. States she's not having a lot of pain and she's happy with the alignment but that she continues to have trouble with shoe gear and also has some pain on the metatarsal where the one pin has moved in a dorsal direction   Review of Systems     Objective:   Physical Exam Neurovascular status intact negative Homans sign was noted with mild to moderate forefoot edema left after having extensive forefoot surgery. Clinically the hallux in rectus position and range of motion is adequate and keratotic lesions are subsiding on the medial side big toe. There is a prominence of the pin on the proximal portion of the left first metatarsal    Assessment:     Doing well with good alignment clinically of the first metatarsal left hallux with procedures done that we'll keep her weightbearing. Moderate increase in edema noted and pin prominence left shaft metatarsal    Plan:     H&P x-rays reviewed. This should go on to heal uneventfully but we will take time and today I advised this patient on continued elevation compression and gradual increase in shoe gear usage. We do want to take the pin out but I want to try to wait about another 4 weeks to allow for complete healing and I did apply padding around the areas to take pressure off of  X-rays indicate the pin has moved in a dorsal direction proximal pin with bone structure looking healthy with mild under correction but clinically doing very well

## 2016-10-04 ENCOUNTER — Ambulatory Visit (INDEPENDENT_AMBULATORY_CARE_PROVIDER_SITE_OTHER): Payer: Medicare Other

## 2016-10-04 ENCOUNTER — Ambulatory Visit (INDEPENDENT_AMBULATORY_CARE_PROVIDER_SITE_OTHER): Payer: Medicare Other | Admitting: Podiatry

## 2016-10-04 DIAGNOSIS — M21612 Bunion of left foot: Secondary | ICD-10-CM | POA: Diagnosis not present

## 2016-10-04 DIAGNOSIS — M2042 Other hammer toe(s) (acquired), left foot: Secondary | ICD-10-CM

## 2016-10-04 DIAGNOSIS — Z9889 Other specified postprocedural states: Secondary | ICD-10-CM

## 2016-10-04 DIAGNOSIS — Z472 Encounter for removal of internal fixation device: Secondary | ICD-10-CM

## 2016-10-06 NOTE — Progress Notes (Signed)
Subjective:     Patient ID: Jaime Weber, female   DOB: 10-Mar-1941, 76 y.o.   MRN: [id number]  HPI patient states she is getting pain on top of her first metatarsal and it's making it hard to wear shoe gear   Review of Systems     Objective:   Physical Exam Neurovascular status intact negative Homans sign was noted with patient found to have a prominent site on the first metatarsal shaft that's painful when pressed and has some redness but no drainage    Assessment:     Irritative pin position of the metatarsal secondary to previous surgery with probable trauma    Plan:     X-rays reviewed and today I did go ahead and recommended removal of the pad and allowed her to read consent form for procedure. Patient stands there is no guarantees as far as success and at this time signs consent form and is scheduled for office surgery for pin removal

## 2016-10-07 ENCOUNTER — Ambulatory Visit: Payer: Medicare Other

## 2016-10-12 ENCOUNTER — Encounter: Payer: Self-pay | Admitting: Podiatry

## 2016-10-12 ENCOUNTER — Ambulatory Visit (INDEPENDENT_AMBULATORY_CARE_PROVIDER_SITE_OTHER): Payer: Medicare Other | Admitting: Podiatry

## 2016-10-12 VITALS — BP 135/81 | HR 57 | Temp 96.8°F | Resp 16

## 2016-10-12 DIAGNOSIS — Z472 Encounter for removal of internal fixation device: Secondary | ICD-10-CM | POA: Diagnosis not present

## 2016-10-12 NOTE — Progress Notes (Signed)
Subjective:     Patient ID: Jaime Weber, female   DOB: 1941/02/17, 76 y.o.   MRN: [id number]  HPI patient presents for removal of pin dorsal left foot stating that it's been sore   Review of Systems     Objective:   Physical Exam Neurovascular status intact with good healing of the left foot secondary to foot surgery with prominent pin position first metatarsal left    Assessment:     Abnormal pin position of the first metatarsal left    Plan:     Reviewed condition and recommended removal. Anesthetized with 60 Milligan times like Marcaine mixture and patient was brought to the OR and sterile prep was applied to the foot. The tourniquet was inflated to 250 mmHg and the first metatarsal was exposed. I made an incision into skin through 16 stitch with hemostasis being acquired as necessary. I further took it down to capsule and made a linear Incision Performed on the Medial Side of the First Metatarsal over Top the Offending Pin Position. I Cleared All Tissue around the Offending Pin and Then Remove the Pin In Toto Flushed the Wound and Then Sutured Skin with 5-0 Nylon. Applied Sterile Dressing Patient Left the OR in Satisfactory Condition with   Fill noted to be immediate to all digits

## 2016-10-26 ENCOUNTER — Encounter: Payer: Medicare Other | Admitting: Podiatry

## 2016-10-27 ENCOUNTER — Ambulatory Visit (INDEPENDENT_AMBULATORY_CARE_PROVIDER_SITE_OTHER): Payer: Self-pay | Admitting: Podiatry

## 2016-10-27 ENCOUNTER — Ambulatory Visit (INDEPENDENT_AMBULATORY_CARE_PROVIDER_SITE_OTHER): Payer: Medicare Other

## 2016-10-27 ENCOUNTER — Encounter: Payer: Self-pay | Admitting: Podiatry

## 2016-10-27 DIAGNOSIS — M2042 Other hammer toe(s) (acquired), left foot: Secondary | ICD-10-CM

## 2016-10-27 DIAGNOSIS — M779 Enthesopathy, unspecified: Secondary | ICD-10-CM

## 2016-10-28 NOTE — Progress Notes (Signed)
Subjective:    Patient ID: Jaime Weber, female   DOB: 76 y.o.   MRN: [id number]   HPI patient presents stating I'm doing fine with pin removal but I was just concerned because I had my foot and I've got some swelling    ROS      Objective:  Physical Exam Neurovascular status intact wound edges coapted well left first metatarsal with good alignment and moderate edema of the foot that has moderate tenderness    Assessment:    Overall doing well with moderate edema and possible trauma along with well-healed surgical site left first metatarsal     Plan:    Stitches removed and sterile dressing applied across the incision site and x-rays evaluated. Gradually increase activity levels and swelling should continue to go down even though to be slow. Reappoint 8 weeks or earlier if needed  X-rays indicate that there is healing of the osteotomy sites with pin screws in place and wire in place in satisfactory resection and removal of the more proximal pin

## 2016-11-09 ENCOUNTER — Encounter: Payer: Medicare Other | Admitting: Podiatry

## 2016-11-25 NOTE — Progress Notes (Signed)
Office Surgery DOS 04.18.2018 Removal pin left 1st metatarsal.

## 2016-12-22 ENCOUNTER — Ambulatory Visit (INDEPENDENT_AMBULATORY_CARE_PROVIDER_SITE_OTHER): Payer: Medicare Other | Admitting: Podiatry

## 2016-12-22 ENCOUNTER — Ambulatory Visit (INDEPENDENT_AMBULATORY_CARE_PROVIDER_SITE_OTHER): Payer: Medicare Other

## 2016-12-22 ENCOUNTER — Encounter: Payer: Self-pay | Admitting: Podiatry

## 2016-12-22 VITALS — BP 128/74 | HR 76 | Resp 16

## 2016-12-22 DIAGNOSIS — M2042 Other hammer toe(s) (acquired), left foot: Secondary | ICD-10-CM

## 2016-12-22 DIAGNOSIS — M779 Enthesopathy, unspecified: Secondary | ICD-10-CM | POA: Diagnosis not present

## 2016-12-22 DIAGNOSIS — M21619 Bunion of unspecified foot: Secondary | ICD-10-CM

## 2016-12-22 MED ORDER — TRIAMCINOLONE ACETONIDE 10 MG/ML IJ SUSP
10.0000 mg | Freq: Once | INTRAMUSCULAR | Status: AC
  Administered 2016-12-22: 10 mg

## 2016-12-22 NOTE — Progress Notes (Signed)
Subjective:    Patient ID: Jaime Weber, female   DOB: 76 y.o.   MRN: [id number]   HPI patient states she's very satisfied with her foot but has developed pain between the fourth and fifth digits that she wanted checked. Wants to have her right foot fixed and October or she has a bunion deformity    ROS      Objective:  Physical Exam neurovascular status intact muscle strength adequate inflammatory changes on the inside of the fourth digit left with significant rotational component of the fifth digit. All other toes are healing well with good alignment and no pain in wearing normal shoe gear. Shown to have bunion deformity right     Assessment:    Doing well structural correction left with digital deformities fourth and fifth digits with structural bunion deformity right     Plan:    H&P conditions reviewed and today I did a proximal nerve block of the right left fourth digit did a careful interphalangeal injection 1 mg Dexon some Kenalog 2 mill grams Xylocaine and debris did lesion applied padding and then discussed correction of the right foot going over the surgical procedure necessary. Reappoint to recheck  X-rays left indicated that the osteotomy is healing well with wire is in place and good alignment

## 2017-02-13 ENCOUNTER — Other Ambulatory Visit: Payer: Self-pay | Admitting: Nurse Practitioner

## 2017-02-13 ENCOUNTER — Other Ambulatory Visit: Payer: Self-pay | Admitting: Neurosurgery

## 2017-02-13 DIAGNOSIS — Z1231 Encounter for screening mammogram for malignant neoplasm of breast: Secondary | ICD-10-CM

## 2017-03-01 ENCOUNTER — Encounter: Payer: Self-pay | Admitting: Podiatry

## 2017-03-01 ENCOUNTER — Ambulatory Visit: Payer: Medicare Other

## 2017-03-01 ENCOUNTER — Ambulatory Visit (INDEPENDENT_AMBULATORY_CARE_PROVIDER_SITE_OTHER): Payer: Medicare Other

## 2017-03-01 ENCOUNTER — Ambulatory Visit (INDEPENDENT_AMBULATORY_CARE_PROVIDER_SITE_OTHER): Payer: Medicare Other | Admitting: Podiatry

## 2017-03-01 DIAGNOSIS — M2042 Other hammer toe(s) (acquired), left foot: Secondary | ICD-10-CM | POA: Diagnosis not present

## 2017-03-01 DIAGNOSIS — M2011 Hallux valgus (acquired), right foot: Secondary | ICD-10-CM

## 2017-03-01 NOTE — Progress Notes (Signed)
Subjective:    Patient ID: Jaime Weber, female   DOB: 76 y.o.   MRN: [id number]   HPI patient states my left foot is improving but is still sore with certain shoes and the right bunion and second toe are really bothering me more and I want to talk about getting this corrected    ROS      Objective:  Physical Exam neurovascular status intact with patient's left foot improving with good range of motion no crepitus of the joint and relative good position of the toes with mild discomfort between the fourth and fifth toe that's localized. Structural bunion with redness and pain around the first metatarsal head right and elevated second toe right     Assessment:  Doing well overall surgery left with continued improvement and gradual return to soft shoes      Plan:   H&P condition reviewed and discussed correction of the right foot. She wants to get this done but needs to wait 6-8 weeks but would like to do the consent form today and I allowed her to be consent form going over alternative treatments complications. Patient wants surgery signed consent form after extensive review and is scheduled for outpatient surgery right and for the left will continue with soft shoe gear that's wider as he continues to heal  X-rays indicate that the osteotomy left has healed well and fixation is in place with good alignment overall good correction of the intermetatarsal angle

## 2017-03-01 NOTE — Patient Instructions (Signed)
Pre-Operative Instructions  Congratulations, you have decided to take an important step towards improving your quality of life.  You can be assured that the doctors and staff at [facility] will be with you every step of the way.  Here are some important things you should know:  1. Plan to be at the surgery center/hospital at least 1 (one) hour prior to your scheduled time, unless otherwise directed by the surgical center/hospital staff.  You must have a responsible adult accompany you, remain during the surgery and drive you home.  Make sure you have directions to the surgical center/hospital to ensure you arrive on time. 2. If you are having surgery at [facility] or [facility], you will need a copy of your medical history and physical form from your family physician within one month prior to the date of surgery. We will give you a form for your primary physician to complete.  3. We make every effort to accommodate the date you request for surgery.  However, there are times where surgery dates or times have to be moved.  We will contact you as soon as possible if a change in schedule is required.   4. No aspirin/ibuprofen for one week before surgery.  If you are on aspirin, any non-steroidal anti-inflammatory medications (Mobic, Aleve, Ibuprofen) should not be taken seven (7) days prior to your surgery.  You make take Tylenol for pain prior to surgery.  5. Medications - If you are taking daily heart and blood pressure medications, seizure, reflux, allergy, asthma, anxiety, pain or diabetes medications, make sure you notify the surgery center/hospital before the day of surgery so they can tell you which medications you should take or avoid the day of surgery. 6. No food or drink after midnight the night before surgery unless directed otherwise by surgical center/hospital staff. 7. No alcoholic beverages 24-hours prior to surgery.  No smoking 24-hours prior or 24-hours after  surgery. 8. Wear loose pants or shorts. They should be loose enough to fit over bandages, boots, and casts. 9. Don't wear slip-on shoes. Sneakers are preferred. 10. Bring your boot with you to the surgery center/hospital.  Also bring crutches or a walker if your physician has prescribed it for you.  If you do not have this equipment, it will be provided for you after surgery. 11. If you have not been contacted by the surgery center/hospital by the day before your surgery, call to confirm the date and time of your surgery. 12. Leave-time from work may vary depending on the type of surgery you have.  Appropriate arrangements should be made prior to surgery with your employer. 13. Prescriptions will be provided immediately following surgery by your doctor.  Fill these as soon as possible after surgery and take the medication as directed. Pain medications will not be refilled on weekends and must be approved by the doctor. 14. Remove nail polish on the operative foot and avoid getting pedicures prior to surgery. 15. Wash the night before surgery.  The night before surgery wash the foot and leg well with water and the antibacterial soap provided. Be sure to pay special attention to beneath the toenails and in between the toes.  Wash for at least three (3) minutes. Rinse thoroughly with water and dry well with a towel.  Perform this wash unless told not to do so by your physician.  Enclosed: 1 Ice pack (please put in freezer the night before surgery)   1 Hibiclens skin cleaner     Pre-op instructions  If you have any questions regarding the instructions, please do not hesitate to call our office.  [place]: [address], [place], [place] [address] -- [phone]  [place]: [address] [place], [place] [address] -- [phone]  [place]: [address]  , [place] [address] -- [phone]  [place]: [address], [address], [place], [place] [address] -- [phone]  Website:  [email or web address] 

## 2017-03-20 ENCOUNTER — Ambulatory Visit
Admission: RE | Admit: 2017-03-20 | Discharge: 2017-03-20 | Disposition: A | Discharge status: Home / Self Care | Payer: Medicare Other | Source: Ambulatory Visit | Attending: Neurosurgery | Admitting: Neurosurgery

## 2017-03-20 DIAGNOSIS — Z1231 Encounter for screening mammogram for malignant neoplasm of breast: Secondary | ICD-10-CM

## 2017-03-27 ENCOUNTER — Telehealth: Payer: Self-pay | Admitting: *Deleted

## 2017-03-27 NOTE — Telephone Encounter (Signed)
"  I'm supposed to have surgery in November.  I'd like to cancel that but I'd like for Dr. Mellody Drown nurse to call me back."

## 2017-03-27 NOTE — Telephone Encounter (Signed)
Left message informing pt I was returning her call and would let the surgery coordinator know re cancellation of November surgery.

## 2017-03-31 NOTE — Telephone Encounter (Addendum)
I am calling regarding your surgery.  You want to cancel it.  "I thought I had already talked to someone about this."  You did I was just following up to see if you wanted to reschedule.  "No, I don't at this time."  I called Caren Griffins at [facility] and canceled the surgery for November 13.

## 2017-05-17 ENCOUNTER — Other Ambulatory Visit: Payer: Medicare Other

## 2018-02-13 ENCOUNTER — Other Ambulatory Visit: Payer: Self-pay | Admitting: Nurse Practitioner

## 2018-02-13 DIAGNOSIS — Z1231 Encounter for screening mammogram for malignant neoplasm of breast: Secondary | ICD-10-CM

## 2018-03-21 ENCOUNTER — Ambulatory Visit
Admission: RE | Admit: 2018-03-21 | Discharge: 2018-03-21 | Disposition: A | Discharge status: Home / Self Care | Payer: Medicare Other | Source: Ambulatory Visit | Attending: Nurse Practitioner | Admitting: Nurse Practitioner

## 2018-03-21 DIAGNOSIS — Z1231 Encounter for screening mammogram for malignant neoplasm of breast: Secondary | ICD-10-CM

## 2018-09-10 ENCOUNTER — Telehealth: Payer: Self-pay | Admitting: *Deleted

## 2018-09-10 NOTE — Telephone Encounter (Signed)
I told pt that on occasion the pins can work their way out and that she would benefit from an appt to discuss with Dr. Paulla Dolly. Transferred pt to schedulers.

## 2018-09-10 NOTE — Telephone Encounter (Signed)
Pt called states the wire in her surgery foot from about four years ago is bother ing her when she walks and causing pain in her toes.

## 2018-09-12 ENCOUNTER — Telehealth: Payer: Self-pay | Admitting: *Deleted

## 2018-09-12 ENCOUNTER — Ambulatory Visit (INDEPENDENT_AMBULATORY_CARE_PROVIDER_SITE_OTHER): Payer: Medicare Other | Admitting: Podiatry

## 2018-09-12 ENCOUNTER — Ambulatory Visit (INDEPENDENT_AMBULATORY_CARE_PROVIDER_SITE_OTHER): Payer: Medicare Other

## 2018-09-12 ENCOUNTER — Other Ambulatory Visit: Payer: Self-pay

## 2018-09-12 ENCOUNTER — Encounter: Payer: Self-pay | Admitting: Podiatry

## 2018-09-12 ENCOUNTER — Ambulatory Visit (HOSPITAL_COMMUNITY)
Admission: RE | Admit: 2018-09-12 | Discharge: 2018-09-12 | Disposition: A | Discharge status: Home / Self Care | Payer: Medicare Other | Source: Ambulatory Visit | Attending: Internal Medicine | Admitting: Internal Medicine

## 2018-09-12 ENCOUNTER — Other Ambulatory Visit: Payer: Self-pay | Admitting: Podiatry

## 2018-09-12 DIAGNOSIS — M2012 Hallux valgus (acquired), left foot: Secondary | ICD-10-CM

## 2018-09-12 DIAGNOSIS — R600 Localized edema: Secondary | ICD-10-CM | POA: Diagnosis not present

## 2018-09-12 DIAGNOSIS — M79672 Pain in left foot: Secondary | ICD-10-CM

## 2018-09-12 DIAGNOSIS — M2011 Hallux valgus (acquired), right foot: Secondary | ICD-10-CM

## 2018-09-12 NOTE — Telephone Encounter (Signed)
[facility] - Jaime Weber states pt is negative for DVT.

## 2018-09-12 NOTE — Telephone Encounter (Signed)
[place] - [facility] scheduled appt for today at 2:30pm. I informed pt and she stated, "Are you kidding me I'm almost all the way to [facility]?" I told pt that I could reschedule for an appt tomorrow early morning and pt stated she wanted to get the test over with. Pt asked if they could see her on Friday she had another doctor's appt in [place], and offered to call the office. I told pt I could or she call an schedule at her convenience. Pt states she will call.

## 2018-09-12 NOTE — Progress Notes (Signed)
Subjective:   Patient ID: Jaime Weber, female   DOB: 78 y.o.   MRN: [id number]   HPI Patient presents stating that she is developed a lot of swelling in her left foot encompassing the entire foot first metatarsal second metatarsal with significant swelling with no history of injury.  States is been around a week and also the back of her leg is been somewhat sore   ROS      Objective:  Physical Exam  Neurovascular status unchanged with some discomfort in the calf muscle left with palpation with quite a bit of edema in the left forefoot with pain in the midfoot and extending into the forefoot with no specific area pain with pitting edema noted     Assessment:  Possibility of clot formation versus an inflammatory localized or systemic process     Plan:  H&P conditions reviewed and at this point I did apply Unna boot Ace wrap and advised on elevation and we did send for test for DVT which came back negative.  Patient was instructed to leave the Unna boot on for 3 days then take it off and will be seen back in 1 week  X-rays were negative for signs of fracture did indicate previous osteotomy left which is healing well with fixation in place

## 2018-09-12 NOTE — Telephone Encounter (Signed)
I informed pt of Dr. Mellody Drown review of results venous doppler negative for DVT.

## 2018-09-14 ENCOUNTER — Ambulatory Visit: Payer: Medicare Other | Admitting: Podiatry

## 2018-09-19 ENCOUNTER — Other Ambulatory Visit: Payer: Self-pay

## 2018-09-19 ENCOUNTER — Ambulatory Visit (INDEPENDENT_AMBULATORY_CARE_PROVIDER_SITE_OTHER): Payer: Medicare Other | Admitting: Podiatry

## 2018-09-19 ENCOUNTER — Encounter: Payer: Self-pay | Admitting: Podiatry

## 2018-09-19 DIAGNOSIS — R600 Localized edema: Secondary | ICD-10-CM

## 2018-09-19 DIAGNOSIS — M779 Enthesopathy, unspecified: Secondary | ICD-10-CM

## 2018-09-19 DIAGNOSIS — M7752 Other enthesopathy of left foot: Secondary | ICD-10-CM

## 2018-09-19 MED ORDER — TRIAMCINOLONE ACETONIDE 10 MG/ML IJ SUSP
10.0000 mg | Freq: Once | INTRAMUSCULAR | Status: AC
  Administered 2018-09-19: 10 mg

## 2018-09-19 NOTE — Progress Notes (Signed)
Subjective:   Patient ID: Jaime Weber, female   DOB: 78 y.o.   MRN: [id number]   HPI Patient presents stating the swelling seems to have reduced my foot still hurts especially in the dorsum of the left foot around the first metatarsal base.  States that she still is bothered somewhat also by the swelling and it still present just not to the same degree   ROS      Objective:  Physical Exam  Neurovascular status intact negative Homans sign noted with negative DVT when tested.  Quite a bit of discomfort first metatarsocuneiform joint left and also there is moderate edema still in the foot improved from previous but still present     Assessment:  Appears to be more of an inflammatory condition with the consideration for the continued persistent swelling patient is experiencing     Plan:  H&P condition reviewed I did a sterile prep and injected the first metatarsocuneiform and around the extensor hallucis complex 3 mg Kenalog 5 mg Xylocaine and applied Unna boot to reduce remaining swelling advising her to use compression stocking which was dispensed today along with elevation.  If symptoms persist I want to see her back

## 2019-01-09 ENCOUNTER — Other Ambulatory Visit: Payer: Self-pay | Admitting: Urology

## 2019-03-11 NOTE — Patient Instructions (Addendum)
DUE TO COVID-19 ONLY ONE VISITOR IS ALLOWED TO COME WITH YOU AND STAY IN THE WAITING ROOM ONLY DURING PRE OP AND PROCEDURE DAY OF SURGERY. THE 1 VISITOR MAY VISIT WITH YOU AFTER SURGERY IN YOUR PRIVATE ROOM DURING VISITING HOURS ONLY!  YOU NEED TO HAVE A COVID 19 TEST ON____9-15-2020___ @__after  pre-op appt_____, THIS TEST MUST BE DONE BEFORE SURGERY, COME  [place], [place] , [address].  ([place]) ONCE YOUR COVID TEST IS COMPLETED, PLEASE BEGIN THE QUARANTINE INSTRUCTIONS AS OUTLINED IN YOUR HANDOUT.                Jaime Weber    Your procedure is scheduled on: 03-15-2019   Report to [facility] Main  Entrance   Report to [facility] at 5:30AM     Call this number if you have problems the morning of surgery [phone]    Remember: Do not eat food or drink liquids :After Midnight. BRUSH YOUR TEETH MORNING OF SURGERY AND RINSE YOUR MOUTH OUT, NO CHEWING GUM CANDY OR MINTS.     Take these medicines the morning of surgery with A SIP OF WATER: LEXAPRO                                 You may not have any metal on your body including hair pins and              piercings  Do not wear jewelry, make-up, lotions, powders or perfumes, deodorant             Do not wear nail polish.  Do not shave  48 hours prior to surgery.     Do not bring valuables to the hospital. [place].  Contacts, dentures or bridgework may not be worn into surgery.  YOU MAY BRING A SMALL OVERNIGHT BAG              Please read over the following fact sheets you were given: _____________________________________________________________________             [facility] - Preparing for Surgery Before surgery, you can play an important role.  Because skin is not sterile, your skin needs to be as free of germs as possible.  You can reduce the number of germs on your skin by washing with CHG (chlorahexidine gluconate) soap before  surgery.  CHG is an antiseptic cleaner which kills germs and bonds with the skin to continue killing germs even after washing. Please DO NOT use if you have an allergy to CHG or antibacterial soaps.  If your skin becomes reddened/irritated stop using the CHG and inform your nurse when you arrive at Short Stay. Do not shave (including legs and underarms) for at least 48 hours prior to the first CHG shower.  You may shave your face/neck. Please follow these instructions carefully:  1.  Shower with CHG Soap the night before surgery and the  morning of Surgery.  2.  If you choose to wash your hair, wash your hair first as usual with your  normal  shampoo.  3.  After you shampoo, rinse your hair and body thoroughly to remove the  shampoo.  4.  Use CHG as you would any other liquid soap.  You can apply chg directly  to the skin and wash                       Gently with a scrungie or clean washcloth.  5.  Apply the CHG Soap to your body ONLY FROM THE NECK DOWN.   Do not use on face/ open                           Wound or open sores. Avoid contact with eyes, ears mouth and genitals (private parts).                       Wash face,  Genitals (private parts) with your normal soap.             6.  Wash thoroughly, paying special attention to the area where your surgery  will be performed.  7.  Thoroughly rinse your body with warm water from the neck down.  8.  DO NOT shower/wash with your normal soap after using and rinsing off  the CHG Soap.                9.  Pat yourself dry with a clean towel.            10.  Wear clean pajamas.            11.  Place clean sheets on your bed the night of your first shower and do not  sleep with pets. Day of Surgery : Do not apply any lotions/deodorants the morning of surgery.  Please wear clean clothes to the hospital/surgery center.  FAILURE TO FOLLOW THESE INSTRUCTIONS MAY RESULT IN THE CANCELLATION OF YOUR SURGERY PATIENT  SIGNATURE_________________________________  NURSE SIGNATURE__________________________________  ________________________________________________________________________  WHAT IS A BLOOD TRANSFUSION? Blood Transfusion Information  A transfusion is the replacement of blood or some of its parts. Blood is made up of multiple cells which provide different functions.  Red blood cells carry oxygen and are used for blood loss replacement.  White blood cells fight against infection.  Platelets control bleeding.  Plasma helps clot blood.  Other blood products are available for specialized needs, such as hemophilia or other clotting disorders. BEFORE THE TRANSFUSION  Who gives blood for transfusions?   Healthy volunteers who are fully evaluated to make sure their blood is safe. This is blood bank blood. Transfusion therapy is the safest it has ever been in the practice of medicine. Before blood is taken from a donor, a complete history is taken to make sure that person has no history of diseases nor engages in risky social behavior (examples are intravenous drug use or sexual activity with multiple partners). The donor's travel history is screened to minimize risk of transmitting infections, such as malaria. The donated blood is tested for signs of infectious diseases, such as HIV and hepatitis. The blood is then tested to be sure it is compatible with you in order to minimize the chance of a transfusion reaction. If you or a relative donates blood, this is often done in anticipation of surgery and is not appropriate for emergency situations. It takes many days to process the donated blood. RISKS AND COMPLICATIONS Although transfusion therapy is very safe and saves many lives, the main dangers of transfusion include:   Getting an infectious disease.  Developing a transfusion reaction. This  is an allergic reaction to something in the blood you were given. Every precaution is taken to prevent  this. The decision to have a blood transfusion has been considered carefully by your caregiver before blood is given. Blood is not given unless the benefits outweigh the risks. AFTER THE TRANSFUSION  Right after receiving a blood transfusion, you will usually feel much better and more energetic. This is especially true if your red blood cells have gotten low (anemic). The transfusion raises the level of the red blood cells which carry oxygen, and this usually causes an energy increase.  The nurse administering the transfusion will monitor you carefully for complications. HOME CARE INSTRUCTIONS  No special instructions are needed after a transfusion. You may find your energy is better. Speak with your caregiver about any limitations on activity for underlying diseases you may have. SEEK MEDICAL CARE IF:   Your condition is not improving after your transfusion.  You develop redness or irritation at the intravenous (IV) site. SEEK IMMEDIATE MEDICAL CARE IF:  Any of the following symptoms occur over the next 12 hours:  Shaking chills.  You have a temperature by mouth above 102 F (38.9 C), not controlled by medicine.  Chest, back, or muscle pain.  People around you feel you are not acting correctly or are confused.  Shortness of breath or difficulty breathing.  Dizziness and fainting.  You get a rash or develop hives.  You have a decrease in urine output.  Your urine turns a dark color or changes to pink, red, or brown. Any of the following symptoms occur over the next 10 days:  You have a temperature by mouth above 102 F (38.9 C), not controlled by medicine.  Shortness of breath.  Weakness after normal activity.  The white part of the eye turns yellow (jaundice).  You have a decrease in the amount of urine or are urinating less often.  Your urine turns a dark color or changes to pink, red, or brown. Document Released: 06/10/2000 Document Revised: 09/05/2011 Document  Reviewed: 01/28/2008 [facility] Patient Information 2014 [place], [place].  _______________________________________________________________________

## 2019-03-12 ENCOUNTER — Other Ambulatory Visit: Payer: Self-pay

## 2019-03-12 ENCOUNTER — Encounter (HOSPITAL_COMMUNITY)
Admission: RE | Admit: 2019-03-12 | Discharge: 2019-03-12 | Disposition: A | Discharge status: Home / Self Care | Payer: Medicare Other | Source: Ambulatory Visit | Attending: Urology | Admitting: Urology

## 2019-03-12 ENCOUNTER — Other Ambulatory Visit (HOSPITAL_COMMUNITY)
Admission: RE | Admit: 2019-03-12 | Discharge: 2019-03-12 | Disposition: A | Discharge status: Home / Self Care | Payer: Medicare Other | Source: Ambulatory Visit | Attending: Urology | Admitting: Urology

## 2019-03-12 ENCOUNTER — Encounter (HOSPITAL_COMMUNITY): Payer: Self-pay

## 2019-03-12 DIAGNOSIS — Z01818 Encounter for other preprocedural examination: Secondary | ICD-10-CM | POA: Diagnosis present

## 2019-03-12 DIAGNOSIS — N8189 Other female genital prolapse: Secondary | ICD-10-CM | POA: Diagnosis not present

## 2019-03-12 DIAGNOSIS — Z20828 Contact with and (suspected) exposure to other viral communicable diseases: Secondary | ICD-10-CM | POA: Insufficient documentation

## 2019-03-12 HISTORY — DX: Female genital prolapse, unspecified: N81.9

## 2019-03-12 LAB — COMPREHENSIVE METABOLIC PANEL
ALT: 15 U/L (ref 0–44)
AST: 16 U/L (ref 15–41)
Albumin: 3.7 g/dL (ref 3.5–5.0)
Alkaline Phosphatase: 75 U/L (ref 38–126)
Anion gap: 7 (ref 5–15)
BUN: 21 mg/dL (ref 8–23)
CO2: 26 mmol/L (ref 22–32)
Calcium: 9.1 mg/dL (ref 8.9–10.3)
Chloride: 105 mmol/L (ref 98–111)
Creatinine, Ser: 0.79 mg/dL (ref 0.44–1.00)
GFR calc Af Amer: >60 mL/min (ref >60)
GFR calc non Af Amer: >60 mL/min (ref >60)
Glucose, Bld: 97 mg/dL (ref 70–99)
Potassium: 3.6 mmol/L (ref 3.5–5.1)
Sodium: 138 mmol/L (ref 135–145)
Total Bilirubin: 0.6 mg/dL (ref 0.3–1.2)
Total Protein: 7 g/dL (ref 6.5–8.1)

## 2019-03-12 LAB — ABO/RH: ABO/RH(D): A POS

## 2019-03-12 LAB — CBC
HCT: 42.8 % (ref 36.0–46.0)
Hemoglobin: 13.8 g/dL (ref 12.0–15.0)
MCH: 31 pg (ref 26.0–34.0)
MCHC: 32.2 g/dL (ref 30.0–36.0)
MCV: 96.2 fL (ref 80.0–100.0)
Platelets: 247 10*3/uL (ref 150–400)
RBC: 4.45 MIL/uL (ref 3.87–5.11)
RDW: 12.7 % (ref 11.5–15.5)
WBC: 9.1 10*3/uL (ref 4.0–10.5)
nRBC: 0 % (ref 0.0–0.2)

## 2019-03-12 NOTE — Progress Notes (Addendum)
PCP - Earney Mallet, MD Cardiologist -   Chest x-ray -  EKG -  Stress Test -  ECHO -  Cardiac Cath -   Sleep Study -  CPAP -   Fasting Blood Sugar -  Checks Blood Sugar _____ times a day  Blood Thinner Instructions: Aspirin Instructions: Last Dose:  Anesthesia review:   recently diagnosed with UTI . Started on antibiotics today . Could not recall name of medication . RN asked patient to call us when she gets home to give [place] new abx info . RN will update PTA meds when patient calls. Urine culture collected at pre-op appt.    Patient denies shortness of breath, fever, cough and chest pain at PAT appointment   Patient verbalized understanding of instructions that were given to them at the PAT appointment. Patient was also instructed that they will need to review over the PAT instructions again at home before surgery.

## 2019-03-13 LAB — URINE CULTURE: Culture: NO GROWTH

## 2019-03-13 LAB — NOVEL CORONAVIRUS, NAA (HOSP ORDER, SEND-OUT TO REF LAB; TAT 18-24 HRS): SARS-CoV-2, NAA: NOT DETECTED

## 2019-03-14 ENCOUNTER — Encounter (HOSPITAL_COMMUNITY): Payer: Self-pay | Admitting: Anesthesiology

## 2019-03-14 NOTE — Anesthesia Preprocedure Evaluation (Addendum)
Anesthesia Evaluation  Patient identified by MRN, date of birth, ID band Patient awake    Reviewed: Allergy & Precautions, NPO status , Patient's Chart, lab work & pertinent test results  History of Anesthesia Complications (+) PONV  Airway Mallampati: II  TM Distance: >3 FB Neck ROM: Full    Dental  (+) Dental Advisory Given   Pulmonary neg pulmonary ROS,    breath sounds clear to auscultation       Cardiovascular negative cardio ROS   Rhythm:Regular Rate:Normal     Neuro/Psych negative psych ROS   GI/Hepatic negative GI ROS, Neg liver ROS,   Endo/Other  negative endocrine ROS  Renal/GU negative Renal ROS     Musculoskeletal negative musculoskeletal ROS (+)   Abdominal Normal abdominal exam  (+)   Peds  Hematology negative hematology ROS (+)   Anesthesia Other Findings   Reproductive/Obstetrics                            Anesthesia Physical Anesthesia Plan  ASA: III  Anesthesia Plan: General   Post-op Pain Management:    Induction: Intravenous  PONV Risk Score and Plan: 4 or greater and Ondansetron, Dexamethasone and Treatment may vary due to age or medical condition  Airway Management Planned: Oral ETT  Additional Equipment: None  Intra-op Plan:   Post-operative Plan: Extubation in OR  Informed Consent: I have reviewed the patients History and Physical, chart, labs and discussed the procedure including the risks, benefits and alternatives for the proposed anesthesia with the patient or authorized representative who has indicated his/her understanding and acceptance.     Dental advisory given  Plan Discussed with: CRNA  Anesthesia Plan Comments: (COVID-19 Labs  No results for input(s): DDIMER, FERRITIN, LDH, CRP in the last 72 hours.  Lab Results      Component                Value               Date                      [id number]              NOT DETECTED         03/12/2019            )       Anesthesia Quick Evaluation

## 2019-03-15 ENCOUNTER — Ambulatory Visit (HOSPITAL_COMMUNITY): Payer: Medicare Other

## 2019-03-15 ENCOUNTER — Encounter (HOSPITAL_COMMUNITY)
Admission: RE | Disposition: A | Discharge status: Home / Self Care | Payer: Self-pay | Source: Other Acute Inpatient Hospital | Attending: Urology

## 2019-03-15 ENCOUNTER — Encounter (HOSPITAL_COMMUNITY): Payer: Self-pay

## 2019-03-15 ENCOUNTER — Observation Stay (HOSPITAL_COMMUNITY)
Admission: RE | Admit: 2019-03-15 | Discharge: 2019-03-16 | Disposition: A | Discharge status: Home / Self Care | Payer: Medicare Other | Source: Other Acute Inpatient Hospital | Attending: Urology | Admitting: Urology

## 2019-03-15 DIAGNOSIS — Z79899 Other long term (current) drug therapy: Secondary | ICD-10-CM | POA: Diagnosis not present

## 2019-03-15 DIAGNOSIS — F329 Major depressive disorder, single episode, unspecified: Secondary | ICD-10-CM | POA: Insufficient documentation

## 2019-03-15 DIAGNOSIS — N8110 Cystocele, unspecified: Secondary | ICD-10-CM | POA: Diagnosis present

## 2019-03-15 DIAGNOSIS — N3946 Mixed incontinence: Secondary | ICD-10-CM | POA: Diagnosis not present

## 2019-03-15 DIAGNOSIS — N814 Uterovaginal prolapse, unspecified: Secondary | ICD-10-CM | POA: Diagnosis present

## 2019-03-15 DIAGNOSIS — K5909 Other constipation: Secondary | ICD-10-CM | POA: Diagnosis not present

## 2019-03-15 HISTORY — PX: ROBOTIC ASSISTED LAPAROSCOPIC SACROCOLPOPEXY: SHX5388

## 2019-03-15 HISTORY — PX: CYSTOSCOPY: SHX5120

## 2019-03-15 HISTORY — PX: PUBOVAGINAL SLING: SHX1035

## 2019-03-15 LAB — TYPE AND SCREEN
ABO/RH(D): A POS
Antibody Screen: NEGATIVE

## 2019-03-15 LAB — HEMOGLOBIN AND HEMATOCRIT, BLOOD
HCT: 43.8 % (ref 36.0–46.0)
Hemoglobin: 14.1 g/dL (ref 12.0–15.0)

## 2019-03-15 SURGERY — ROBOTIC ASSISTED LAPAROSCOPIC SACROCOLPOPEXY
Anesthesia: General

## 2019-03-15 MED ORDER — LACTATED RINGERS IV SOLN
INTRAVENOUS | Status: DC
  Administered 2019-03-15 (×2): via INTRAVENOUS

## 2019-03-15 MED ORDER — PHENYLEPHRINE 40 MCG/ML (10ML) SYRINGE FOR IV PUSH (FOR BLOOD PRESSURE SUPPORT)
PREFILLED_SYRINGE | INTRAVENOUS | Status: DC | PRN
  Administered 2019-03-15: 120 ug via INTRAVENOUS
  Administered 2019-03-15: 80 ug via INTRAVENOUS

## 2019-03-15 MED ORDER — HYDROMORPHONE HCL 1 MG/ML IJ SOLN: 0.5000 mg | INTRAMUSCULAR | Status: DC | PRN

## 2019-03-15 MED ORDER — FUROSEMIDE 10 MG/ML IJ SOLN
20.0000 mg | Freq: Once | INTRAMUSCULAR | Status: DC
  Filled 2019-03-15: qty 2

## 2019-03-15 MED ORDER — ONDANSETRON HCL 4 MG/2ML IJ SOLN
4.0000 mg | INTRAMUSCULAR | Status: DC | PRN
  Administered 2019-03-15: 4 mg via INTRAVENOUS
  Filled 2019-03-15: qty 2

## 2019-03-15 MED ORDER — ESTRADIOL 0.1 MG/GM VA CREA
TOPICAL_CREAM | VAGINAL | Status: AC
  Filled 2019-03-15: qty 85

## 2019-03-15 MED ORDER — ACETAMINOPHEN 10 MG/ML IV SOLN
1000.0000 mg | Freq: Four times a day (QID) | INTRAVENOUS | Status: AC
  Administered 2019-03-15 – 2019-03-16 (×4): 1000 mg via INTRAVENOUS
  Filled 2019-03-15 (×4): qty 100

## 2019-03-15 MED ORDER — SODIUM CHLORIDE 0.9 % IR SOLN
Status: DC | PRN
  Administered 2019-03-15: 250 mL via INTRAVESICAL

## 2019-03-15 MED ORDER — EPHEDRINE 5 MG/ML INJ
INTRAVENOUS | Status: AC
  Filled 2019-03-15: qty 10

## 2019-03-15 MED ORDER — PHENYLEPHRINE 40 MCG/ML (10ML) SYRINGE FOR IV PUSH (FOR BLOOD PRESSURE SUPPORT)
PREFILLED_SYRINGE | INTRAVENOUS | Status: AC
  Filled 2019-03-15: qty 10

## 2019-03-15 MED ORDER — ACETAMINOPHEN 160 MG/5ML PO SOLN: 325.0000 mg | Freq: Once | ORAL | Status: DC | PRN

## 2019-03-15 MED ORDER — INDIGOTINDISULFONATE SODIUM 8 MG/ML IJ SOLN
INTRAMUSCULAR | Status: AC
  Filled 2019-03-15: qty 5

## 2019-03-15 MED ORDER — DEXAMETHASONE SODIUM PHOSPHATE 10 MG/ML IJ SOLN
INTRAMUSCULAR | Status: DC | PRN
  Administered 2019-03-15: 4 mg via INTRAVENOUS

## 2019-03-15 MED ORDER — TRAMADOL HCL 50 MG PO TABS: 50.0000 mg | ORAL_TABLET | Freq: Four times a day (QID) | ORAL | 0 refills | Status: DC | PRN

## 2019-03-15 MED ORDER — PROMETHAZINE HCL 25 MG/ML IJ SOLN: 6.2500 mg | INTRAMUSCULAR | Status: DC | PRN

## 2019-03-15 MED ORDER — KETOROLAC TROMETHAMINE 15 MG/ML IJ SOLN
15.0000 mg | Freq: Four times a day (QID) | INTRAMUSCULAR | Status: DC
  Administered 2019-03-15 – 2019-03-16 (×2): 15 mg via INTRAVENOUS
  Filled 2019-03-15 (×2): qty 1

## 2019-03-15 MED ORDER — FENTANYL CITRATE (PF) 250 MCG/5ML IJ SOLN
INTRAMUSCULAR | Status: AC
  Filled 2019-03-15: qty 5

## 2019-03-15 MED ORDER — SUGAMMADEX SODIUM 200 MG/2ML IV SOLN
INTRAVENOUS | Status: DC | PRN
  Administered 2019-03-15: 150 mg via INTRAVENOUS

## 2019-03-15 MED ORDER — ACETAMINOPHEN 325 MG PO TABS: 325.0000 mg | ORAL_TABLET | Freq: Once | ORAL | Status: DC | PRN

## 2019-03-15 MED ORDER — INDIGOTINDISULFONATE SODIUM 8 MG/ML IJ SOLN
INTRAMUSCULAR | Status: DC | PRN
  Administered 2019-03-15: 5 mL via INTRAVENOUS

## 2019-03-15 MED ORDER — ONDANSETRON HCL 4 MG/2ML IJ SOLN
INTRAMUSCULAR | Status: AC
  Filled 2019-03-15: qty 2

## 2019-03-15 MED ORDER — DOCUSATE SODIUM 100 MG PO CAPS: 100.0000 mg | ORAL_CAPSULE | Freq: Two times a day (BID) | ORAL | 0 refills | Status: AC

## 2019-03-15 MED ORDER — HYDROMORPHONE HCL 1 MG/ML IJ SOLN: 0.2500 mg | INTRAMUSCULAR | Status: DC | PRN

## 2019-03-15 MED ORDER — NITROFURANTOIN MONOHYD MACRO 100 MG PO CAPS: 100.0000 mg | ORAL_CAPSULE | Freq: Two times a day (BID) | ORAL | 0 refills | Status: DC

## 2019-03-15 MED ORDER — SODIUM CHLORIDE 0.9 % IV SOLN
INTRAVENOUS | Status: AC
  Filled 2019-03-15: qty 500000

## 2019-03-15 MED ORDER — ESTRADIOL 0.1 MG/GM VA CREA: TOPICAL_CREAM | VAGINAL | 0 refills | Status: DC

## 2019-03-15 MED ORDER — FUROSEMIDE 10 MG/ML IJ SOLN
INTRAMUSCULAR | Status: AC
  Filled 2019-03-15: qty 2

## 2019-03-15 MED ORDER — DEXAMETHASONE SODIUM PHOSPHATE 10 MG/ML IJ SOLN
INTRAMUSCULAR | Status: AC
  Filled 2019-03-15: qty 1

## 2019-03-15 MED ORDER — LIDOCAINE-EPINEPHRINE (PF) 1 %-1:200000 IJ SOLN
INTRAMUSCULAR | Status: AC
  Filled 2019-03-15: qty 30

## 2019-03-15 MED ORDER — BUPIVACAINE LIPOSOME 1.3 % IJ SUSP
20.0000 mL | Freq: Once | INTRAMUSCULAR | Status: AC
  Administered 2019-03-15: 20 mL
  Filled 2019-03-15: qty 20

## 2019-03-15 MED ORDER — ROCURONIUM BROMIDE 10 MG/ML (PF) SYRINGE
PREFILLED_SYRINGE | INTRAVENOUS | Status: DC | PRN
  Administered 2019-03-15: 50 mg via INTRAVENOUS
  Administered 2019-03-15: 10 mg via INTRAVENOUS
  Administered 2019-03-15 (×3): 20 mg via INTRAVENOUS

## 2019-03-15 MED ORDER — SUGAMMADEX SODIUM 500 MG/5ML IV SOLN
INTRAVENOUS | Status: AC
  Filled 2019-03-15: qty 5

## 2019-03-15 MED ORDER — BUPIVACAINE-EPINEPHRINE 0.5% -1:200000 IJ SOLN
INTRAMUSCULAR | Status: AC
  Filled 2019-03-15: qty 1

## 2019-03-15 MED ORDER — DOCUSATE SODIUM 100 MG PO CAPS
100.0000 mg | ORAL_CAPSULE | Freq: Two times a day (BID) | ORAL | Status: DC
  Administered 2019-03-15 – 2019-03-16 (×2): 100 mg via ORAL
  Filled 2019-03-15 (×2): qty 1

## 2019-03-15 MED ORDER — PROPOFOL 10 MG/ML IV BOLUS
INTRAVENOUS | Status: DC | PRN
  Administered 2019-03-15: 150 mg via INTRAVENOUS

## 2019-03-15 MED ORDER — DEXTROSE-NACL 5-0.45 % IV SOLN
INTRAVENOUS | Status: DC
  Administered 2019-03-15 – 2019-03-16 (×2): via INTRAVENOUS

## 2019-03-15 MED ORDER — GLYCOPYRROLATE 0.2 MG/ML IJ SOLN
INTRAMUSCULAR | Status: DC | PRN
  Administered 2019-03-15: 0.2 mg via INTRAVENOUS

## 2019-03-15 MED ORDER — ESCITALOPRAM OXALATE 10 MG PO TABS
10.0000 mg | ORAL_TABLET | Freq: Every day | ORAL | Status: DC
  Administered 2019-03-15: 10 mg via ORAL
  Filled 2019-03-15 (×2): qty 1

## 2019-03-15 MED ORDER — FUROSEMIDE 10 MG/ML IJ SOLN
INTRAMUSCULAR | Status: DC | PRN
  Administered 2019-03-15: 5 mg via INTRAMUSCULAR

## 2019-03-15 MED ORDER — LIDOCAINE 2% (20 MG/ML) 5 ML SYRINGE
INTRAMUSCULAR | Status: AC
  Filled 2019-03-15: qty 5

## 2019-03-15 MED ORDER — ACETAMINOPHEN 10 MG/ML IV SOLN: 1000.0000 mg | Freq: Once | INTRAVENOUS | Status: DC | PRN

## 2019-03-15 MED ORDER — ESTRADIOL 0.1 MG/GM VA CREA
TOPICAL_CREAM | VAGINAL | Status: DC | PRN
  Administered 2019-03-15: 2 via VAGINAL

## 2019-03-15 MED ORDER — MEPERIDINE HCL 50 MG/ML IJ SOLN: 6.2500 mg | INTRAMUSCULAR | Status: DC | PRN

## 2019-03-15 MED ORDER — GLYCOPYRROLATE PF 0.2 MG/ML IJ SOSY
PREFILLED_SYRINGE | INTRAMUSCULAR | Status: AC
  Filled 2019-03-15: qty 1

## 2019-03-15 MED ORDER — PROPOFOL 10 MG/ML IV BOLUS
INTRAVENOUS | Status: AC
  Filled 2019-03-15: qty 20

## 2019-03-15 MED ORDER — LIDOCAINE 2% (20 MG/ML) 5 ML SYRINGE
INTRAMUSCULAR | Status: DC | PRN
  Administered 2019-03-15: 40 mg via INTRAVENOUS

## 2019-03-15 MED ORDER — PHENAZOPYRIDINE HCL 200 MG PO TABS
200.0000 mg | ORAL_TABLET | ORAL | Status: AC
  Administered 2019-03-15: 200 mg via ORAL
  Filled 2019-03-15: qty 1

## 2019-03-15 MED ORDER — CLINDAMYCIN PHOSPHATE 900 MG/50ML IV SOLN
900.0000 mg | INTRAVENOUS | Status: AC
  Administered 2019-03-15: 900 mg via INTRAVENOUS
  Filled 2019-03-15: qty 50

## 2019-03-15 MED ORDER — TRAMADOL HCL 50 MG PO TABS: 50.0000 mg | ORAL_TABLET | Freq: Four times a day (QID) | ORAL | Status: DC | PRN

## 2019-03-15 MED ORDER — EPHEDRINE SULFATE-NACL 50-0.9 MG/10ML-% IV SOSY
PREFILLED_SYRINGE | INTRAVENOUS | Status: DC | PRN
  Administered 2019-03-15: 5 mg via INTRAVENOUS

## 2019-03-15 MED ORDER — INDIGOTINDISULFONATE SODIUM 8 MG/ML IJ SOLN
INTRAMUSCULAR | Status: DC | PRN
  Administered 2019-03-15 (×2): 5 mL via INTRAVENOUS

## 2019-03-15 MED ORDER — STERILE WATER FOR IRRIGATION IR SOLN
Status: DC | PRN
  Administered 2019-03-15: 3000 mL via INTRAVESICAL

## 2019-03-15 MED ORDER — ONDANSETRON HCL 4 MG/2ML IJ SOLN
INTRAMUSCULAR | Status: DC | PRN
  Administered 2019-03-15: 4 mg via INTRAVENOUS

## 2019-03-15 MED ORDER — BUPIVACAINE-EPINEPHRINE 0.5% -1:200000 IJ SOLN
INTRAMUSCULAR | Status: DC | PRN
  Administered 2019-03-15: 50 mL

## 2019-03-15 MED ORDER — FENTANYL CITRATE (PF) 100 MCG/2ML IJ SOLN
INTRAMUSCULAR | Status: DC | PRN
  Administered 2019-03-15 (×4): 50 ug via INTRAVENOUS

## 2019-03-15 MED ORDER — SODIUM CHLORIDE 0.9 % IV SOLN
INTRAVENOUS | Status: DC | PRN
  Administered 2019-03-15: 500 mL

## 2019-03-15 MED ORDER — LACTATED RINGERS IV SOLN: INTRAVENOUS | Status: DC

## 2019-03-15 MED ORDER — LIDOCAINE-EPINEPHRINE (PF) 1 %-1:200000 IJ SOLN
INTRAMUSCULAR | Status: DC | PRN
  Administered 2019-03-15: 4 mL

## 2019-03-15 MED ORDER — SUCCINYLCHOLINE CHLORIDE 200 MG/10ML IV SOSY
PREFILLED_SYRINGE | INTRAVENOUS | Status: AC
  Filled 2019-03-15: qty 10

## 2019-03-15 MED ORDER — SUCCINYLCHOLINE CHLORIDE 200 MG/10ML IV SOSY
PREFILLED_SYRINGE | INTRAVENOUS | Status: DC | PRN
  Administered 2019-03-15: 120 mg via INTRAVENOUS

## 2019-03-15 SURGICAL SUPPLY — 99 items
BAG URINE DRAINAGE (UROLOGICAL SUPPLIES) ×2 IMPLANT
BLADE HEX COATED 2.75 (ELECTRODE) IMPLANT
BLADE SURG 15 STRL LF DISP TIS (BLADE) IMPLANT
BLADE SURG 15 STRL SS (BLADE)
BRIEF STRETCH FOR OB PAD LRG (UNDERPADS AND DIAPERS) ×2 IMPLANT
CATH FOLEY 2WAY 5CC 16FR (CATHETERS) ×1
CATH FOLEY 2WAY SLVR  5CC 14FR (CATHETERS) ×1
CATH FOLEY 2WAY SLVR 5CC 14FR (CATHETERS) ×1 IMPLANT
CATH FOLEY 3WAY 30CC 16FR (CATHETERS) ×2 IMPLANT
CATH URTH STD 16FR FL 2W DRN (CATHETERS) ×1 IMPLANT
CHLORAPREP W/TINT 26 (MISCELLANEOUS) ×2 IMPLANT
CLIP VESOLOCK LG 6/CT PURPLE (CLIP) ×2 IMPLANT
CLIP VESOLOCK MED LG 6/CT (CLIP) IMPLANT
COVER MAYO STAND STRL (DRAPES) ×4 IMPLANT
COVER SURGICAL LIGHT HANDLE (MISCELLANEOUS) ×2 IMPLANT
COVER TIP SHEARS 8 DVNC (MISCELLANEOUS) ×1 IMPLANT
COVER TIP SHEARS 8MM DA VINCI (MISCELLANEOUS) ×1
COVER WAND RF STERILE (DRAPES) IMPLANT
DECANTER SPIKE VIAL GLASS SM (MISCELLANEOUS) ×2 IMPLANT
DERMABOND ADVANCED (GAUZE/BANDAGES/DRESSINGS) ×1
DERMABOND ADVANCED .7 DNX12 (GAUZE/BANDAGES/DRESSINGS) ×1 IMPLANT
DRAIN CHANNEL RND F F (WOUND CARE) IMPLANT
DRAIN PENROSE 18X1/4 LTX STRL (WOUND CARE) IMPLANT
DRAPE ARM DVNC X/XI (DISPOSABLE) ×4 IMPLANT
DRAPE COLUMN DVNC XI (DISPOSABLE) ×1 IMPLANT
DRAPE DA VINCI XI ARM (DISPOSABLE) ×4
DRAPE DA VINCI XI COLUMN (DISPOSABLE) ×1
DRAPE INCISE IOBAN 66X45 STRL (DRAPES) ×2 IMPLANT
DRAPE SHEET LG 3/4 BI-LAMINATE (DRAPES) ×4 IMPLANT
DRAPE SURG IRRIG POUCH 19X23 (DRAPES) ×2 IMPLANT
DRAPE UNDERBUTTOCKS STRL (DISPOSABLE) ×2 IMPLANT
ELECT PENCIL ROCKER SW 15FT (MISCELLANEOUS) ×2 IMPLANT
ELECT REM PT RETURN 15FT ADLT (MISCELLANEOUS) ×2 IMPLANT
GAUZE 4X4 16PLY RFD (DISPOSABLE) ×6 IMPLANT
GAUZE PACKING 1 X5 YD ST (GAUZE/BANDAGES/DRESSINGS) ×4 IMPLANT
GAUZE PACKING 2X5 YD STRL (GAUZE/BANDAGES/DRESSINGS) IMPLANT
GLOVE BIO SURGEON STRL SZ 6.5 (GLOVE) ×6 IMPLANT
GLOVE BIOGEL M STRL SZ7.5 (GLOVE) ×6 IMPLANT
GLOVE ECLIPSE 8.0 STRL XLNG CF (GLOVE) ×2 IMPLANT
GOWN STRL REUS W/ TWL XL LVL3 (GOWN DISPOSABLE) IMPLANT
GOWN STRL REUS W/TWL LRG LVL3 (GOWN DISPOSABLE) ×10 IMPLANT
GOWN STRL REUS W/TWL XL LVL3 (GOWN DISPOSABLE) ×2 IMPLANT
HOLDER FOLEY CATH W/STRAP (MISCELLANEOUS) ×2 IMPLANT
IRRIG SUCT STRYKERFLOW 2 WTIP (MISCELLANEOUS) ×2
IRRIGATION SUCT STRKRFLW 2 WTP (MISCELLANEOUS) ×1 IMPLANT
KIT BASIN OR (CUSTOM PROCEDURE TRAY) ×2 IMPLANT
KIT TURNOVER KIT A (KITS) ×2 IMPLANT
MANIPULATOR UTERINE 4.5 ZUMI (MISCELLANEOUS) IMPLANT
MARKER SKIN DUAL TIP RULER LAB (MISCELLANEOUS) IMPLANT
MESH Y UPSYLON VAGINAL (Mesh General) ×2 IMPLANT
NEEDLE HYPO 22GX1.5 SAFETY (NEEDLE) ×2 IMPLANT
NEEDLE MAYO 6 CRC TAPER PT (NEEDLE) IMPLANT
OCCLUDER COLPOPNEUMO (BALLOONS) IMPLANT
PACK CYSTO (CUSTOM PROCEDURE TRAY) ×2 IMPLANT
PACKING VAGINAL (PACKING) IMPLANT
PAD OB MATERNITY 4.3X12.25 (PERSONAL CARE ITEMS) ×2 IMPLANT
PAD POSITIONING PINK XL (MISCELLANEOUS) ×2 IMPLANT
PLUG CATH AND CAP STER (CATHETERS) ×2 IMPLANT
PORT ACCESS TROCAR AIRSEAL 12 (TROCAR) IMPLANT
PORT ACCESS TROCAR AIRSEAL 5M (TROCAR)
POUCH SPECIMEN RETRIEVAL 10MM (ENDOMECHANICALS) IMPLANT
RETRACTOR STAY HOOK 5MM (MISCELLANEOUS) ×2 IMPLANT
SEAL CANN UNIV 5-8 DVNC XI (MISCELLANEOUS) ×4 IMPLANT
SEAL XI 5MM-8MM UNIVERSAL (MISCELLANEOUS) ×4
SET IRRIG Y TYPE TUR BLADDER L (SET/KITS/TRAYS/PACK) ×2 IMPLANT
SET TRI-LUMEN FLTR TB AIRSEAL (TUBING) IMPLANT
SHEET LAVH (DRAPES) ×2 IMPLANT
SOLUTION ELECTROLUBE (MISCELLANEOUS) ×2 IMPLANT
SUT CAPIO ETHIBPND (SUTURE) IMPLANT
SUT CAPIO POLYGLYCOLIC (SUTURE) IMPLANT
SUT ETHIBOND 0 (SUTURE) IMPLANT
SUT MNCRL AB 4-0 PS2 18 (SUTURE) ×4 IMPLANT
SUT PROLENE 2 0 CT 1 (SUTURE) ×2 IMPLANT
SUT SILK 2 0 SH (SUTURE) IMPLANT
SUT VIC AB 0 CT1 27 (SUTURE) ×1
SUT VIC AB 0 CT1 27XBRD ANTBC (SUTURE) ×1 IMPLANT
SUT VIC AB 2-0 CT1 27 (SUTURE) ×2
SUT VIC AB 2-0 CT1 27XBRD (SUTURE) ×2 IMPLANT
SUT VIC AB 2-0 SH 27 (SUTURE) ×9
SUT VIC AB 2-0 SH 27X BRD (SUTURE) ×2 IMPLANT
SUT VIC AB 2-0 SH 27XBRD (SUTURE) ×7 IMPLANT
SUT VIC AB 3-0 SH 27 (SUTURE) ×3
SUT VIC AB 3-0 SH 27X BRD (SUTURE) ×3 IMPLANT
SUT VIC AB 3-0 SH 27XBRD (SUTURE) IMPLANT
SUT VIC AB 4-0 PS2 27 (SUTURE) ×2 IMPLANT
SUT VICRYL 0 UR6 27IN ABS (SUTURE) ×4 IMPLANT
SUT VICRYL 4-0 PS2 18IN ABS (SUTURE) ×4 IMPLANT
SYR 10ML LL (SYRINGE) IMPLANT
SYR 50ML LL SCALE MARK (SYRINGE) IMPLANT
SYR BULB IRRIGATION 50ML (SYRINGE) IMPLANT
SYR CONTROL 10ML LL (SYRINGE) ×2 IMPLANT
TOWEL OR 17X26 10 PK STRL BLUE (TOWEL DISPOSABLE) ×4 IMPLANT
TOWEL OR NON WOVEN STRL DISP B (DISPOSABLE) ×2 IMPLANT
TRAY LAPAROSCOPIC (CUSTOM PROCEDURE TRAY) ×2 IMPLANT
TROCAR XCEL 12X100 BLDLESS (ENDOMECHANICALS) ×2 IMPLANT
TUBING CONNECTING 10 (TUBING) ×4 IMPLANT
WATER STERILE IRR 1000ML POUR (IV SOLUTION) ×2 IMPLANT
YANKAUER SUCT BULB TIP 10FT TU (MISCELLANEOUS) ×2 IMPLANT
YANKAUER SUCT BULB TIP NO VENT (SUCTIONS) ×2 IMPLANT

## 2019-03-15 NOTE — Op Note (Signed)
Preoperative diagnosis: Urgency incontinence Postoperative diagnosis: Stress and urgency incontinence Surgery: Sling cystourethropexy and cystoscopy Surgeon: Dr. Nicki Reaper Mcdiarmid  The patient's history has been very well documented.  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  Position was good.  Robotic sacrocolpopexy looked great.  She has a very capacious vagina still had a modest posterior defect not surprisingly.  It was easy to expose at the sub-urethra  Two 1 cm incisions were made 1.5 cm lateral to the midline just above the symphysis pubis.  I made a careful suburethral incision in the mid urethra that was long.  It was appropriate depth.  I had instilled 4 cc of lidocaine epinephrine mixture.  Usual sharp and blunt dissection to expose urethrovesical angle bilaterally  With the bladder empty I passed a trocar on top of along the back the symphysis pubis onto the pulp of the index finger using my box technique.  They were delivered nicely safely.  I cystoscoped the patient.  It took about 20 minutes with the patient had excellent reflux bilaterally at the ureteral orifice.  The ureters were actually difficult to see based upon her capacious vagina and bladder.  There was no bladder injury.  Urethra was normal.  Possibly prior to the sling was done.  I could not see the ureters but bladder was not entered  WIth the bladder empty I attached the mesh and brought up through the retropubic technique and tension over the fat part of a moderate size Kelly clamp.  If anything it was 1 or 2 mm on the looser side but within my normal standard.  I closed the anterior vaginal wall with running 2-0 Vicryl suture followed by 2 interrupted sutures.  The mesh was cut below the skin and closed with interrupted 4-0 Vicryl and Dermabond.  Vaginal pack was applied  The patient would leak around the scope and cystoscope due to her  large capacity but very spastic bladder.  This was noted throughout the case.  Hopefully this operation will improve her mixed incontinence

## 2019-03-15 NOTE — H&P (Signed)
Incontinence stable. Frequency stable. I reviewed my previous notes. Patient was scheduled to have a sling reaching the end of the treatment algorithm for her mixed incontinence with concern for persistent or worsening overactive bladder including bedwetting. She saw our nurse practitioner recently and the pessary was removed and a failed attempt at a different 1 was applied. She was left without a pessary. She is given estrogen cream. She was noted to have an ulcer   Patient's pessary has been in for approximately 10 months. It has been followed by Dr. Tennis Must and 1st put in by him. He switched over the changing visits to [facility]. The pessary has been out for 24 hours. She feels bulge in the vagina. She was given the estrogen cream. She did present with some blood from the vagina   There is no other aggravating or relieving factors  There is no other associated signs and symptoms  The severity of the symptoms is moderate  The symptoms are ongoing and bothersome   On pelvic examination the patient is a very impressive posterior and apically defect. The defect filled and just exited the introitus. She likely has an enterocele. She had a very voluminous vagina and I could not located definite culture. The tissues actually looked quite healthy. She had a lot of length. With the apex and posterior wall reduced she had a high grade 2 cystocele and no stress incontinence. She had urethral folds underneath the urethra.   Picture was drawn. Based upon pelvic examination a sacral spinous fixation likely would Not give her proper reduction based upon volume of vagina and length. She would likely best be best served with the sacral colpopexy. She could have a sling simultaneously. Any enterocele could be dealt with at the same time. I do believe with reduction posteriorly and apically that she would not have a distal rectocele. It seemed to reduce quite well distally when I reduced the prolapse.   Pressure he was  replaced today by nurse practitioner   I strongly believe that a transvaginal approach will not reduce the patient's voluminous long vagina. She will see Dr. Louis Meckel for possible robotic sacrocolpopexy. I would do the sling at the [facility]. She would like to proceed.   I sent a note to Pam to hold off on the outpatient sling procedure     CC/HPI: I have pain or burning with urination.    ALLERGIES: Carbaphen 12 SUSP - Skin Rash Cephalosporins - Skin Rash Penicillins - Skin Rash Sulfa Drugs - Skin Rash Thiazide Diuretics - Skin Rash    MEDICATIONS: Calcium TABS Oral  Escitalopram Oxalate 10 mg tablet Oral  Latanoprost 0.005 % drops Ophthalmic  Lexapro  Liver-Kidney Cleanser  Magnesium 400 mg magnesium capsule Oral  Premarin 0.625 mg/gram cream with applicator 0.5 gram Per Vagina 3x/week  Premarin 0.625 mg/gram cream with applicator 0.5 gram Per Vagina Daily  Probiotic CAPS Oral  Super Omega 3 CAPS Oral  Trimo-San 0.025 %-0.01 % jelly with applicator 1 Applicator Per vagina 2 nights a week  Vitamin C 500 mg tablet Oral  Vitamin D3 50 mcg (2,000 unit) capsule Oral     GU PSH: Catheterization For Collection Of Specimen, Single Patient, All Places Of Service - 11/06/2018 Complex cystometrogram, w/ void pressure and urethral pressure profile studies, any technique - 05/08/2018 Complex Uroflow - 09/14/2018, 05/08/2018 Cysto Fulgurate < 0.5 cm - 2017 Cystourethroscopy, W/Injection For Chemodenervation Of Bladder - 08/29/2018 Emg surf Electrd - 05/08/2018 Hysterectomy Unilat SO Inject For cystogram -  05/08/2018 Interstim Stage 1 - 10/09/2018 Intrabd voidng Press - 05/08/2018    NON-GU PSH: Appendectomy Remove Tonsils Repair Hammertoe, Left    GU PMH: Cystocele, Unspec, She is doing well with the pessary. She does complain about about discharge and once it changed every 3 months. - 07/11/2018 Incontinence w/o Sensation - 04/24/2018 Mixed incontinence - 04/24/2018 Nocturia (Stable) -  04/24/2018, - 02/19/2018, - 12/22/2017, - 10/26/2017 (Worsening, Chronic), Most likely related to elevated PVR. Will have pt begin timed/double voiding. Stop all fluids 3 hrs before bedtime and than begin Tamsulosin 0.4 mg 1 po daily and f/u 1 month for PVR. If no improvement of sxs will proceed with UDS and f/u w/Dr. McDiarmid for second opinion. Culture urine. No ABX unless culture proven UTI, - 03/07/2017, Nocturia, - Oct 18, 2015 Nocturnal Enuresis - 04/24/2018 Urinary Frequency (Stable) - 04/24/2018, Increased urinary frequency, - 10-18-2015 Rectocele (Stable), Pessary is doing well. Changed today - 03/23/2018, Pessary placed today, - 02/19/2018, Rectocele, female, - 10-18-14 Urge incontinence (Worsening), Worsened with pessary use - 03/23/2018, - 02/19/2018, - 12/22/2017, - 10/26/2017 Hemorrhagic cystitis (w/o hematuria), Inflammation of bladder - 2015-10-18 Other Disorders Of Bladder, Bladder mass - 2015/10/18 Abdominal Pain Unspec, Abdominal pain - October 17, 2013, Abdominal pain, - 10/17/2013    NON-GU PMH: Other constipation, Constipation, chronic - Oct 18, 2015 Encounter for general adult medical examination without abnormal findings, Encounter for preventive health examination - 2015-10-18    FAMILY HISTORY: Death - Mother, Father Oral-mouth cancer - Runs In Family   SOCIAL HISTORY: Marital Status: Divorced Preferred Language: English; Ethnicity: Not Hispanic Or Latino; Race: White Current Smoking Status: Patient has never smoked.   Tobacco Use Assessment Completed: Used Tobacco in last 30 days? Does not use smokeless tobacco. Types of alcohol consumed: Wine. Social Drinker.  Does not use drugs. Drinks 1 caffeinated drink per day.     Notes: 2 children   REVIEW OF SYSTEMS:    GU Review Female:   Patient reports get up at night to urinate and frequent urination. Patient denies have to strain to urinate, trouble starting your stream, hard to postpone urination, burning /pain with urination, leakage of urine, stream starts and stops, and being  pregnant.  Gastrointestinal (Upper):   Patient denies nausea, vomiting, and indigestion/ heartburn.  Gastrointestinal (Lower):   Patient denies diarrhea and constipation.  Constitutional:   Patient denies fever, night sweats, weight loss, and fatigue.  Skin:   Patient denies skin rash/ lesion and itching.  Eyes:   Patient denies blurred vision and double vision.  Ears/ Nose/ Throat:   Patient denies sore throat and sinus problems.  Hematologic/Lymphatic:   Patient denies swollen glands and easy bruising.  Cardiovascular:   Patient denies leg swelling and chest pains.  Respiratory:   Patient denies cough and shortness of breath.  Endocrine:   Patient denies excessive thirst.  Musculoskeletal:   Patient denies back pain and joint pain.  Neurological:   Patient denies headaches and dizziness.  Psychologic:   Patient denies depression and anxiety.   VITAL SIGNS:      11/08/2018 02:17 PM  Weight 168.2 lb / 76.29 kg  Height 67 in / 170.18 cm  BP 119/73 mmHg  Pulse 76 /min  Temperature 98.0 F / 36.6 C  BMI 26.3 kg/m   PAST DATA REVIEWED:  Source Of History:  Patient   PROCEDURES: None   ASSESSMENT:      ICD-10 Details  1 GU:   Cystocele, Unspec - N81.10   2   Incontinence w/o  Sensation - N39.42      PLAN:            Medications New Meds: Estrace 0.01 % cream with applicator 0.5 gram Per Vagina 3x/week   #42.5  6 Refill(s)  Premarin 0.625 mg/gram cream with applicator 0.5 gram Per Vagina 3x/week   #42.5  6 Refill(s)            Schedule Return Visit/Planned Activity: 2 Weeks - Office Visit, Refer to [facility] Doc             Note: Dr Rayburn Go prolapse          Document Letter(s):  Created for Patient: Clinical Summary   After a thorough review of the management options for the patient's condition the patient  elected to proceed with surgical therapy as noted above. We have discussed the potential benefits and risks of the procedure, side effects of the proposed treatment, the  likelihood of the patient achieving the goals of the procedure, and any potential problems that might occur during the procedure or recuperation. Informed consent has been obtained.

## 2019-03-15 NOTE — H&P (Signed)
Pt presents today for pre-operative history and physical exam in anticipation of robotic assisted lap sacrocolpopexy by Dr. Louis Meckel and mid urethral sling by Dr. Matilde Sprang on 03/15/19. She has been evaled by Dr. Carrolyn Leigh a cardiologist in [place], [place] but her records have not been sent to our office. She states she was told her echo was "normal" but she does not know the results of her holter monitor. She also states her dizzy/fainting episodes are due to vertigo and have improved with an "anti-motion" medication that she cannot name.   She did come in for self cath teaching this morning and got her supplies.   Pt denies F/C, HA, CP, SOB, N/V, diarrhea, back pain, flank pain, hematuria, and dysuria. She has chronic constipation which she treats with OTC herbs.   HX:     CC: Pelvic organ prolapse  HPI: Jaime Weber is a 78 year-old female established patient who is here for further evaluation of her pelvic prolapse.  The patient has had prolapse for more than a year. She has been wearing a pessary device for the past year or so. With a pessary taken out she has complete prolapse. She has both a anterior and posterior defect. Her prolapse was noted to be quite extensive with a voluminous vagina. She also has stress urinary incontinence. Initially showed both urge and stress incontinence. She has worked with Dr. Matilde Sprang and her urge incontinence has improved some.   The patient is here today for further discussion sacral colpopexy.   Interval: I saw the patient about a month ago and we discussed surgery. At that time she was not quite ready to proceed. Since that time she has had her pessary exchanged. She has also had some issues with vulvitis that was treated by [facility]. In addition, the patient has had some fainting and weakness spells. She is currently wearing a Holter monitor and has follow-up scheduled with her cardiologist 1 week from now to review the results. She also has had some lower  extremity swelling. Beyond this, she feels otherwise reasonably well. She denies any significant urinary tract symptoms including dysuria or hematuria. She is going through a ton of pads and depends. On average she goes through 45 pads a week.   She first noticed her pelvic prolapse approximately 12/25/2016. She does not have pelvic pain. Her symptoms have been stable over the last year.   She does not have an abnormal sensation when she needs to urinate. She is not urinating more frequently now than usual. She does have a good size and strength to her urinary stream. She is not having problems with emptying her bladder well.   She is having problems with urinary control or incontinence. She does wear protective pads. She wears 3-4 pads per day. She can get to the bathroom in time when she gets the urge to urinate. She does leak urine when she coughs, laughs, sneezes or bears down. The patient does not have a history of recurrent UTIs.   The patient does not posture when she voids or defecates. The patient does not have trouble with constipation.   The patient has a history of hysterectomy.     ALLERGIES: Carbaphen 12 SUSP - Skin Rash Cephalosporins - Skin Rash Penicillins - Skin Rash Sulfa Drugs - Skin Rash Thiazide Diuretics - Skin Rash    MEDICATIONS: Estrace 0.01 % cream with applicator 0.5 gram Per Vagina 3x/week  Calcium TABS Oral  Escitalopram Oxalate 10 mg tablet Oral  Latanoprost 0.005 %  drops Ophthalmic  Lexapro  Liver-Kidney Cleanser  Magnesium 400 mg magnesium capsule Oral  Premarin 0.625 mg/gram cream with applicator 0.5 gram Per Vagina 3x/week  Premarin 0.625 mg/gram cream with applicator 0.5 gram Per Vagina 3x/week  Probiotic CAPS Oral  Super Omega 3 CAPS Oral  Trimo-San 0.025 %-0.01 % jelly with applicator 1 Applicator Per vagina 2 nights a week  Vitamin C 500 mg tablet Oral  Vitamin D3 50 mcg (2,000 unit) capsule Oral     GU PSH: Catheterization For Collection Of  Specimen, Single Patient, All Places Of Service - 01/24/2019, 11/06/2018 Complex cystometrogram, w/ void pressure and urethral pressure profile studies, any technique - 05/08/2018 Complex Uroflow - 09/14/2018, 05/08/2018 Cysto Fulgurate < 0.5 cm - 2015-10-17 Cystourethroscopy, W/Injection For Chemodenervation Of Bladder - 08/29/2018 Emg surf Electrd - 05/08/2018 Hysterectomy Unilat SO Inject For cystogram - 05/08/2018 Interstim Stage 1 - 10/09/2018 Intrabd voidng Press - 05/08/2018     NON-GU PSH: Appendectomy Remove Tonsils Repair Hammertoe, Left     GU PMH: Female genital prolapse, unspecified - 02/07/2019, - 01/24/2019, - 01/03/2019 Candidal cystitis and urethritis - 01/24/2019 Dysuria - 01/24/2019 Cystocele, Unspec, She is doing well with the pessary. She does complain about about discharge and once it changed every 3 months. - 07/11/2018 Incontinence w/o Sensation - 04/24/2018 Mixed incontinence - 04/24/2018 Nocturia (Stable) - 04/24/2018, - 02/19/2018, - 12/22/2017, - 10/26/2017 (Worsening, Chronic), Most likely related to elevated PVR. Will have pt begin timed/double voiding. Stop all fluids 3 hrs before bedtime and than begin Tamsulosin 0.4 mg 1 po daily and f/u 1 month for PVR. If no improvement of sxs will proceed with UDS and f/u w/Dr. McDiarmid for second opinion. Culture urine. No ABX unless culture proven UTI, - 03/07/2017, Nocturia, - 10/17/15 Nocturnal Enuresis - 04/24/2018 Urinary Frequency (Stable) - 04/24/2018, Increased urinary frequency, - 2015/10/17 Rectocele (Stable), Pessary is doing well. Changed today - 03/23/2018, Pessary placed today, - 02/19/2018, Rectocele, female, - October 17, 2014 Urge incontinence (Worsening), Worsened with pessary use - 03/23/2018, - 02/19/2018, - 12/22/2017, - 10/26/2017 Hemorrhagic cystitis (w/o hematuria), Inflammation of bladder - 10-17-15 Other Disorders Of Bladder, Bladder mass - 10/17/2015 Abdominal Pain Unspec, Abdominal pain - 2013-10-16, Abdominal pain, - 10-16-2013      PMH Notes: Depression    NON-GU PMH: Other constipation, Constipation, chronic - 10/17/2015 Encounter for general adult medical examination without abnormal findings, Encounter for preventive health examination - Oct 17, 2015    FAMILY HISTORY: Death - Mother, Father Oral-mouth cancer - Runs In Family   SOCIAL HISTORY: Marital Status: Divorced Preferred Language: English; Ethnicity: Not Hispanic Or Latino; Race: White Current Smoking Status: Patient has never smoked.   Tobacco Use Assessment Completed: Used Tobacco in last 30 days? Does not use smokeless tobacco. Does not drink anymore.  Does not use drugs. Drinks 1 caffeinated drink per day. Has not had a blood transfusion.     Notes: 2 children   REVIEW OF SYSTEMS:    GU Review Female:   Patient reports frequent urination, hard to postpone urination, get up at night to urinate, and leakage of urine. Patient denies burning /pain with urination, stream starts and stops, trouble starting your stream, have to strain to urinate, and being pregnant.  Gastrointestinal (Upper):   Patient denies nausea, vomiting, and indigestion/ heartburn.  Gastrointestinal (Lower):   Patient reports constipation. Patient denies diarrhea.  Constitutional:   Patient denies fever, night sweats, weight loss, and fatigue.  Skin:   Patient denies skin rash/ lesion and itching.  Eyes:  Patient denies blurred vision and double vision.  Ears/ Nose/ Throat:   Patient denies sore throat and sinus problems.  Hematologic/Lymphatic:   Patient denies swollen glands and easy bruising.  Cardiovascular:   Patient reports leg swelling. Patient denies chest pains.  Respiratory:   Patient denies cough and shortness of breath.  Endocrine:   Patient denies excessive thirst.  Musculoskeletal:   Patient denies back pain and joint pain.  Neurological:   Patient reports dizziness. Patient denies headaches.  Psychologic:   Patient reports depression. Patient denies anxiety.   VITAL SIGNS:      03/05/2019 01:47  PM  Weight 159 lb / 72.12 kg  Height 67 in / 170.18 cm  BP 114/78 mmHg  Pulse 84 /min  Temperature 98.0 F / 36.6 C  BMI 24.9 kg/m   MULTI-SYSTEM PHYSICAL EXAMINATION:    Constitutional: Well-nourished. No physical deformities. Normally developed. Good grooming.  Neck: Neck symmetrical, not swollen. Normal tracheal position.  Respiratory: Normal breath sounds. No labored breathing, no use of accessory muscles.   Cardiovascular: Abnormal heart rhythm; irregular  Lymphatic: No enlargement of neck, axillae, groin.  Skin: No paleness, no jaundice, no cyanosis. No lesion, no ulcer, no rash.  Neurologic / Psychiatric: Oriented to time, oriented to place, oriented to person. No depression, no anxiety, no agitation.  Gastrointestinal: No mass, no tenderness, no rigidity, non obese abdomen.  Eyes: Normal conjunctivae. Normal eyelids.  Ears, Nose, Mouth, and Throat: Left ear no scars, no lesions, no masses. Right ear no scars, no lesions, no masses. Nose no scars, no lesions, no masses. Normal hearing. Normal lips.  Musculoskeletal: Normal gait and station of head and neck.     PAST DATA REVIEWED:  Source Of History:  Patient  Records Review:   Previous Patient Records  Urine Test Review:   Urinalysis   03/05/19  Urinalysis  Urine Appearance Slightly Cloudy   Urine Color Yellow   Urine Glucose Neg mg/dL  Urine Bilirubin Neg mg/dL  Urine Ketones Neg mg/dL  Urine Specific Gravity 1.020   Urine Blood 2+ ery/uL  Urine pH <=5.0   Urine Protein Neg mg/dL  Urine Urobilinogen 0.2 mg/dL  Urine Nitrites Neg   Urine Leukocyte Esterase 3+ leu/uL  Urine WBC/hpf >60/hpf   Urine RBC/hpf 3 - 10/hpf   Urine Epithelial Cells 0 - 5/hpf   Urine Bacteria Many (>50/hpf)   Urine Mucous Not Present   Urine Yeast NS (Not Seen)   Urine Trichomonas Not Present   Urine Cystals NS (Not Seen)   Urine Casts NS (Not Seen)   Urine Sperm Not Present    PROCEDURES:          Urinalysis w/Scope Dipstick  Dipstick Cont'd Micro  Color: Yellow Bilirubin: Neg mg/dL WBC/hpf: >60/hpf  Appearance: Slightly Cloudy Ketones: Neg mg/dL RBC/hpf: 3 - 10/hpf  Specific Gravity: 1.020 Blood: 2+ ery/uL Bacteria: Many (>50/hpf)  pH: <=5.0 Protein: Neg mg/dL Cystals: NS (Not Seen)  Glucose: Neg mg/dL Urobilinogen: 0.2 mg/dL Casts: NS (Not Seen)    Nitrites: Neg Trichomonas: Not Present    Leukocyte Esterase: 3+ leu/uL Mucous: Not Present      Epithelial Cells: 0 - 5/hpf      Yeast: NS (Not Seen)      Sperm: Not Present    ASSESSMENT:      ICD-10 Details  1 GU:   Female genital prolapse, unspecified - N81.9    PLAN:            Medications Stop  Meds: Estrace 0.01 % cream with applicator 0.5 gram Per Vagina 3x/week  Start: 11/08/2018  Discontinue: 03/05/2019  - Reason: The medication cycle was completed.  Latanoprost 0.005 % drops Ophthalmic  Start: 07/13/2015  Discontinue: 03/05/2019  - Reason: The medication cycle was completed.  Premarin 0.625 mg/gram cream with applicator 0.5 gram Per Vagina 3x/week  Start: 11/08/2018  Discontinue: 03/05/2019  - Reason: The medication cycle was completed.  Premarin 0.625 mg/gram cream with applicator 0.5 gram Per Vagina 3x/week  Start: 11/06/2018  Discontinue: 03/05/2019  - Reason: The medication cycle was completed.  Trimo-San 0.025 %-0.01 % jelly with applicator 1 Applicator Per vagina 2 nights a week  Start: 07/11/2018  Discontinue: 03/05/2019  - Reason: The medication cycle was completed.            Orders Labs Urine Culture          Schedule Return Visit/Planned Activity: Keep Scheduled Appointment - Schedule Surgery          Document Letter(s):  Created for Patient: Clinical Summary         Notes:   There are no significant changes in the patients history or physical exam since last evaluation by Dr. Louis Meckel. Pt is scheduled to undergo robotic assisted sacrocolpopexy on 03/15/19 pending cardiac clearance. Pam is going to speak with Dr. Real Cons  office regarding her results.   Urine culture sent today as pt's urine appears infected. Will call if positive with instructions regarding Abx.   All pt's questions were answered to the best of my ability. Pt was reminded that she cannot drive for 2 weeks and cannot return to work for 6 weeks post op.

## 2019-03-15 NOTE — Anesthesia Procedure Notes (Signed)
Procedure Name: Intubation Date/Time: 03/15/2019 7:44 AM Performed by: Niel Hummer, CRNA Pre-anesthesia Checklist: Patient identified, Emergency Drugs available, Suction available and Patient being monitored Patient Re-evaluated:Patient Re-evaluated prior to induction Oxygen Delivery Method: Circle system utilized Preoxygenation: Pre-oxygenation with 100% oxygen Induction Type: IV induction Laryngoscope Size: Mac and 4 Grade View: Grade I Tube type: Oral Tube size: 7.0 mm Number of attempts: 1 Airway Equipment and Method: Stylet Placement Confirmation: ETT inserted through vocal cords under direct vision,  positive ETCO2 and breath sounds checked- equal and bilateral Secured at: 22 cm Tube secured with: Tape Dental Injury: Teeth and Oropharynx as per pre-operative assessment

## 2019-03-15 NOTE — Op Note (Signed)
Preoperative diagnosis:  1. Pelvic organ prolapse  Postoperative diagnosis:  1. Same  Procedure: 1. Robotic-assisted laparoscopic sacrocolpopexy  Surgeon: Ardis Hughs, MD First assistant: Debbrah Alar, PA-C Resident assistant: Kerrie Pleasure, MD  Anesthesia: General  Complications: None  Intraoperative findings:  [employer] mesh used for the sacrocolpopexy.  EBL: 50 mL  Specimens:none  Indication: Jaime Weber is a 78 y.o. female patient with symptomatic pelvic organ prolapse.   After reviewing the management options for treatment, she elected to proceed with the above surgical procedure(s). We have discussed the potential benefits and risks of the procedure, side effects of the proposed treatment, the likelihood of the patient achieving the goals of the procedure, and any potential problems that might occur during the procedure or recuperation. Informed consent has been obtained.  Description of procedure:  The patient was taken to the operating room and general anesthesia was induced. The patient was placed in the dorsal lithotomy position, prepped and draped in the usual sterile fashion, and preoperative antibiotics were administered. A preoperative time-out was performed.   A Foley catheter was then placed and placed to gravity drainage. I then made a periumbilical incision carrying the dissection down to the patient's fascia with electrocautery. Once to the fascia, the fascia was incised and a small puncture hole made in the peritoneum to allow passage of a 79mm port.  The abdomen was insufflated and the remaining ports placed under digital guidance. 2 ports were placed lateral to the umbilicus on the right proximally 10 cm apart. The most lateral port was approximately 3 cm above the anterior iliac spine. 2 additional ports were placed in the patient's right side in comparable positions to the most lateral port on the right was a 12 mm port.the  robot was then docked at an angle from the leg obliquely along the side of the left leg.  We then began our surgery by cleaning up some of the pelvic adhesions to the small bowel and colon. Once this was completed I started dissecting at the sacral promontory located 3 cm medial to the location where the ureter crosses over the iliac vessels at the pelvic brim. The posterior peritoneum was incised and the sacral prominence cleared off an area taking care to avoid the middle sacral vessels and the iliac branches. I then created a posterior peritoneal tunnel starting at the sacral promontory and tunneling down the right pelvic sidewall down into the pelvis breaking back through the posterior peritoneum around the vesico-vaginal junction posteriorly. I then continued the posterior dissection retracting down on the rectum and finding the avascular plane between the posterior vaginal wall and the rectum. I carried this dissection down as far as I could to along the area of the perineal body.  I then turned my attention to the anterior plane between the anterior vaginal wall and the bladder. I was able to obtain access to the avascular plane and with a combination of both monopolar cautery and blunt dissection was able to clean and nice down to the bladder neck.  Mesh was measured at approximately 8.5 cm anteriorly and 8.5 cm posteriorly and I cut this on the back table. The mesh was then placed into the patient's abdomen through the assistant port and the anterior leaf was secured down onto the anterior vaginal wall with the apex at the bladder neck. The posterior leaf was then secured down on the posterior vaginal wall. These were sewn down with 2-0 Vicryl. Between 6 and 8  were done on each side. At this point I then went back to the previously dissected sacral promontory and posterior peritoneal tunnel and inserted a instrument through the tunnel and grasped the end of the mesh at the vaginal cuff and  pull it up to the sacrum. I then checked to ensure that the sacral mesh was not too tight by performing a vaginal exam. I then secured the sacral leg of the mesh using a 0 Prolene. I then reapproximated the posterior peritoneum with a 2-0 Vicryl in a running fashion around the sacral promontory. The pelvic peritoneum was closed using a pursestring. The fascia of the camera incision was then closed with 0 Vicryl in a figure-of-eight fashion. The skin was closed with 4-0 Monocryl's. Dermabond was applied to the incision and exparel injected into the incisions.  The case was then turned over to Dr. Matilde Sprang who performed a mid-urethral sling.

## 2019-03-15 NOTE — Discharge Instructions (Signed)

## 2019-03-15 NOTE — Transfer of Care (Signed)
Immediate Anesthesia Transfer of Care Note  Patient: Jaime Weber  Procedure(s) Performed: ROBOTIC ASSISTED LAPAROSCOPIC SACROCOLPOPEXY (N/A ) PUBO-VAGINAL SLING (N/A ) CYSTOSCOPY (N/A )  Patient Location: PACU  Anesthesia Type:General  Level of Consciousness: awake  Airway & Oxygen Therapy: Patient Spontanous Breathing and Patient connected to face mask oxygen  Post-op Assessment: Report given to RN and Post -op Vital signs reviewed and stable  Post vital signs: Reviewed and stable  Last Vitals:  Vitals Value Taken Time  BP 128/62 03/15/19 1239  Temp    Pulse 64 03/15/19 1240  Resp 18 03/15/19 1240  SpO2 100 % 03/15/19 1240  Vitals shown include unvalidated device data.  Last Pain:  Vitals:   03/15/19 0533  TempSrc: Oral         Complications: No apparent anesthesia complications

## 2019-03-15 NOTE — Interval H&P Note (Signed)
History and Physical Interval Note:  03/15/2019 7:36 AM  Jaime Weber  has presented today for surgery, with the diagnosis of PELVIC ORGAN PROLOPSE.  The various methods of treatment have been discussed with the patient and family. After consideration of risks, benefits and other options for treatment, the patient has consented to  Procedure(s): ROBOTIC ASSISTED LAPAROSCOPIC SACROCOLPOPEXY (N/A) PUBO-VAGINAL SLING (N/A) as a surgical intervention.  The patient's history has been reviewed, patient examined, no change in status, stable for surgery.  I have reviewed the patient's chart and labs.  Questions were answered to the patient's satisfaction.     Deyani Hegarty A Fredericka Bottcher

## 2019-03-15 NOTE — Progress Notes (Signed)
Pt received from [place], RN, agree with her previous assessment. Will continue to monitor.

## 2019-03-15 NOTE — Interval H&P Note (Signed)
History and Physical Interval Note: Cardiology has cleared the patient, no concerns.  No changes to History and Physical.  Proceed as scheduled.  03/15/2019 7:22 AM  Jaime Weber  has presented today for surgery, with the diagnosis of [place].  The various methods of treatment have been discussed with the patient and family. After consideration of risks, benefits and other options for treatment, the patient has consented to  Procedure(s): ROBOTIC ASSISTED LAPAROSCOPIC SACROCOLPOPEXY (N/A) PUBO-VAGINAL SLING (N/A) as a surgical intervention.  The patient's history has been reviewed, patient examined, no change in status, stable for surgery.  I have reviewed the patient's chart and labs.  Questions were answered to the patient's satisfaction.     Ardis Hughs

## 2019-03-16 DIAGNOSIS — N8110 Cystocele, unspecified: Secondary | ICD-10-CM | POA: Diagnosis not present

## 2019-03-16 LAB — BASIC METABOLIC PANEL
Anion gap: 6 (ref 5–15)
BUN: 13 mg/dL (ref 8–23)
CO2: 26 mmol/L (ref 22–32)
Calcium: 8.3 mg/dL — ABNORMAL LOW (ref 8.9–10.3)
Chloride: 102 mmol/L (ref 98–111)
Creatinine, Ser: 0.68 mg/dL (ref 0.44–1.00)
GFR calc Af Amer: >60 mL/min (ref >60)
GFR calc non Af Amer: >60 mL/min (ref >60)
Glucose, Bld: 103 mg/dL — ABNORMAL HIGH (ref 70–99)
Potassium: 3.4 mmol/L — ABNORMAL LOW (ref 3.5–5.1)
Sodium: 134 mmol/L — ABNORMAL LOW (ref 135–145)

## 2019-03-16 LAB — HEMOGLOBIN AND HEMATOCRIT, BLOOD
HCT: 35.8 % — ABNORMAL LOW (ref 36.0–46.0)
Hemoglobin: 11.4 g/dL — ABNORMAL LOW (ref 12.0–15.0)

## 2019-03-16 NOTE — Progress Notes (Signed)
Reviewed chart and spoke at length with patient and family Reviewed surgery We had a long talk re expectations and a very candid and polite review of potential efficacy results and complications- normally would be surprised by comments but consistent with previous Post op detailed  Will call Monday Knows how to do CIC Large capacity spastic bladder during surgery also discussed

## 2019-03-16 NOTE — Progress Notes (Signed)
Pt was able to void 70mL yellow urine. Bladder scan yielded 634mL remaining. MD Graylon Good made aware. Orders received to help patient self cath. Cath yielded 676mL of yellow/straw urine. Will continue to monitor pt.

## 2019-03-16 NOTE — Discharge Summary (Signed)
Physician Discharge Summary  Patient ID: Jaime Weber MRN: [id number] DOB/AGE: 1941-06-22 78 y.o.  Admit date: 03/15/2019 Discharge date: 03/16/2019  Admission Diagnoses:  Discharge Diagnoses:  Active Problems:   Cystocele with prolapse   Discharged Condition: good  Hospital Course:  78 year old female who underwent robot assisted laparoscopic sacral colpopexy and mid urethral sling placement for pelvic organ prolapse and incontinence on 03/15/2019 without acute complication.  Patient did well postoperatively.  Pain was well controlled and she was ambulating well.  Vaginal packing was removed on postop day 1.  On postop day 1 her Foley catheter was removed, and she was found to have notable difficulty urinating.  She finally was able to void a small amount with a PVR of 600 cc requiring self-catheterization.  Catheterization effectively emptied bladder.  As urinary retention is not unexpected after this procedure and patient demonstrated adequate understanding of CIC teaching, she was discharged home with plan to continue CIC as needed.  Daughter and patient were very comfortable with plan.   Discharge Exam: Blood pressure 126/73, pulse 71, temperature 98.2 F (36.8 C), temperature source Oral, resp. rate 17, SpO2 98 %. General: No acute distress Cardiac: Normal rate Pulmonary: Normal work of breathing GI: Soft, appropriately tender, incisions clean dry intact. GU: No active bleeding, vaginal packing removed.  Foley catheter removed prior to discharge.  Disposition: Discharge disposition: 01-Home or Self Care        Allergies as of 03/16/2019      Reactions   Cephalosporins Rash   Penicillins Rash   Did it involve swelling of the face/tongue/throat, SOB, or low BP? No Did it involve sudden or severe rash/hives, skin peeling, or any reaction on the inside of your mouth or nose? no Did you need to seek medical attention at a hospital or doctor's office? No When did it last  happen? Many years ago If all above answers are "NO", may proceed with cephalosporin use.   Sulfa Antibiotics Rash   Adhesive [tape] Rash   Benadryl [diphenhydramine Hcl] Rash   Hydrocodone Rash   Prednisone Itching, Rash      Medication List    TAKE these medications   docusate sodium 100 MG capsule Commonly known as: Colace Take 1 capsule (100 mg total) by mouth 2 (two) times daily. Can stop taking if having more than 2 bowel movements/day.   escitalopram 10 MG tablet Commonly known as: LEXAPRO Take 10 mg by mouth daily.   estradiol 0.1 MG/GM vaginal cream Commonly known as: ESTRACE VAGINAL Apply one pea sized dolyp to index finger and apply just inside the vaginal opening every other day.   MAGNESIUM PO Take by mouth daily. Magnesium Optimizer   MAXI-CALM PO Take by mouth daily. Calm supplement for constipation   MULTI-ENZYME PO Take by mouth daily. Whole enzyme supplement   nitrofurantoin (macrocrystal-monohydrate) 100 MG capsule Commonly known as: Macrobid Take 1 capsule (100 mg total) by mouth 2 (two) times daily.   ONE-A-DAY BONE STRENGTH PO Take by mouth daily.   SUPER OMEGA 3 PO Take by mouth daily.   traMADol 50 MG tablet Commonly known as: Ultram Take 1-2 tablets (50-100 mg total) by mouth every 6 (six) hours as needed for moderate pain.   ULTRAFLORA IMMUNE HEALTH PO Take by mouth daily. Ultra-Flora Balance supplement   vitamin C 1000 MG tablet Take 1,000 mg by mouth daily.   VITAMIN D-3 PO Take by mouth daily.      Follow-up Information    Azucena Fallen  Tharpe, NP On 03/29/2019.   Specialty: Urology Why: 9:45 Contact information: [address] [place] [address] [phone]           Signed: Haskel Schroeder 03/16/2019, 3:50 PM

## 2019-03-16 NOTE — Care Management Obs Status (Signed)
[address] NOTIFICATION   Patient Details  Name: Jaime Weber MRN: [id number] Date of Birth: 12-03-1940   Medicare Observation Status Notification Given:  Yes    Joaquin Courts, RN 03/16/2019, 4:15 PM

## 2019-03-18 ENCOUNTER — Encounter (HOSPITAL_COMMUNITY): Payer: Self-pay | Admitting: Urology

## 2019-03-18 NOTE — Anesthesia Postprocedure Evaluation (Signed)
Anesthesia Post Note  Patient: Jaime Weber  Procedure(s) Performed: ROBOTIC ASSISTED LAPAROSCOPIC SACROCOLPOPEXY (N/A ) PUBO-VAGINAL SLING (N/A ) CYSTOSCOPY (N/A )     Patient location during evaluation: PACU Anesthesia Type: General Level of consciousness: awake and alert Pain management: pain level controlled Vital Signs Assessment: post-procedure vital signs reviewed and stable Respiratory status: spontaneous breathing, nonlabored ventilation, respiratory function stable and patient connected to nasal cannula oxygen Cardiovascular status: blood pressure returned to baseline and stable Postop Assessment: no apparent nausea or vomiting Anesthetic complications: no    Last Vitals:  Vitals:   03/16/19 0444 03/16/19 1313  BP: 117/64 126/73  Pulse: 61 71  Resp: 18 17  Temp: (!) 36.4 C 36.8 C  SpO2: 99% 98%    Last Pain:  Vitals:   03/16/19 1313  TempSrc: Oral  PainSc:                  [name]

## 2019-03-21 ENCOUNTER — Encounter (HOSPITAL_COMMUNITY): Payer: Self-pay | Admitting: Urology

## 2019-04-08 ENCOUNTER — Other Ambulatory Visit: Payer: Self-pay | Admitting: Family Medicine

## 2019-04-08 DIAGNOSIS — Z1231 Encounter for screening mammogram for malignant neoplasm of breast: Secondary | ICD-10-CM

## 2019-05-27 ENCOUNTER — Ambulatory Visit: Payer: Medicare Other

## 2020-03-17 ENCOUNTER — Encounter: Payer: Self-pay | Admitting: Physician Assistant

## 2020-03-17 ENCOUNTER — Ambulatory Visit (INDEPENDENT_AMBULATORY_CARE_PROVIDER_SITE_OTHER): Payer: Medicare Other | Admitting: Physician Assistant

## 2020-03-17 ENCOUNTER — Other Ambulatory Visit: Payer: Self-pay

## 2020-03-17 DIAGNOSIS — L739 Follicular disorder, unspecified: Secondary | ICD-10-CM

## 2020-03-17 DIAGNOSIS — D485 Neoplasm of uncertain behavior of skin: Secondary | ICD-10-CM

## 2020-03-17 DIAGNOSIS — Z86018 Personal history of other benign neoplasm: Secondary | ICD-10-CM | POA: Diagnosis not present

## 2020-03-17 DIAGNOSIS — Z1283 Encounter for screening for malignant neoplasm of skin: Secondary | ICD-10-CM | POA: Diagnosis not present

## 2020-03-17 MED ORDER — CLOBETASOL PROPIONATE 0.05 % EX SOLN: 1.0000 "application " | Freq: Two times a day (BID) | CUTANEOUS | 5 refills | Status: DC

## 2020-03-18 NOTE — Progress Notes (Signed)
Follow-Up Visit   Subjective  Jaime Weber is a 79 y.o. female who presents for the following: Annual Exam (last ov 10/2018 all over check).   The following portions of the chart were reviewed this encounter and updated as appropriate: Tobacco  Allergies  Meds  Problems  Med Hx  Surg Hx  Fam Hx      Objective  Well appearing patient in no apparent distress; mood and affect are within normal limits.  A full examination was performed including scalp, head, eyes, ears, nose, lips, neck, chest, axillae, abdomen, back, buttocks, bilateral upper extremities, bilateral lower extremities, hands, feet, fingers, toes, fingernails, and toenails. All findings within normal limits unless otherwise noted below.  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  Assessment & Plan  History of dysplastic nevus Right Thigh - Anterior  observe  Neoplasm of uncertain behavior of skin (4) mid chest  Epidermal / dermal shaving  Lesion diameter (cm):  1 Informed consent: discussed and consent obtained   Timeout: patient name, date of birth, surgical site, and procedure verified   Procedure prep:  Patient was prepped and draped in usual sterile fashion Prep type:  Chlorhexidine Anesthesia: the lesion was anesthetized in a standard fashion   Anesthetic:  1% lidocaine w/ epinephrine 1-100,000 local infiltration Instrument used: DermaBlade   Hemostasis achieved with: aluminum chloride   Outcome: patient tolerated procedure well   Post-procedure details: sterile dressing applied and wound care instructions given   Dressing type: petrolatum gauze, petrolatum and bandage     Specimen 1 - Surgical pathology Differential Diagnosis: sk Check Margins: yes  left flank  Epidermal / dermal shaving  Lesion diameter (cm):  1 Informed consent: discussed and consent obtained   Timeout: patient name, date of birth, surgical site, and procedure verified   Procedure prep:  Patient was prepped and draped in usual sterile fashion Prep type:  Chlorhexidine Anesthesia: the lesion was anesthetized in a standard fashion   Anesthetic:  1% lidocaine w/ epinephrine 1-100,000 local infiltration Instrument used: DermaBlade   Hemostasis achieved with: aluminum chloride   Outcome: patient tolerated procedure well   Post-procedure details: sterile dressing applied and wound care instructions given   Dressing type: petrolatum gauze, petrolatum and bandage    Specimen 2 - Surgical pathology Differential Diagnosis: sk Check Margins: yes  Left Preauricular  Epidermal / dermal shaving  Lesion diameter (cm):  1.3 Informed consent: discussed and consent obtained   Timeout: patient name, date of birth, surgical site, and procedure verified   Procedure prep:  Patient was prepped and draped in usual sterile fashion Prep type:  Chlorhexidine Anesthesia: the lesion was anesthetized in a standard fashion   Anesthetic:  1% lidocaine w/ epinephrine 1-100,000 local infiltration Instrument used: DermaBlade   Hemostasis achieved with: aluminum chloride   Outcome: patient tolerated procedure well   Post-procedure details: sterile dressing applied and wound care instructions given   Dressing type: petrolatum gauze, petrolatum and bandage    Specimen 3 - Surgical pathology Differential Diagnosis: sk Check Margins: yes  Right Anterior Neck  Epidermal / dermal shaving  Lesion diameter (cm):  0.3 Informed consent: discussed and consent  obtained   Timeout: patient name, date of birth, surgical site, and procedure verified   Procedure prep:  Patient was prepped and draped in usual  sterile fashion Prep type:  Chlorhexidine Anesthesia: the lesion was anesthetized in a standard fashion   Anesthetic:  1% lidocaine w/ epinephrine 1-100,000 local infiltration Instrument used: DermaBlade   Hemostasis achieved with: aluminum chloride   Outcome: patient tolerated procedure well   Post-procedure details: sterile dressing applied and wound care instructions given   Dressing type: petrolatum gauze, petrolatum and bandage    Specimen 4 - Surgical pathology Differential Diagnosis: sk Check Margins: yes  Folliculitis occipital scalp  Ordered Medications: clobetasol (TEMOVATE) 0.05 % external solution   I, Kylian Loh, PA-C, have reviewed all documentation's for this visit.  The documentation on 03/18/20 for the exam, diagnosis, procedures and orders are all accurate and complete.

## 2020-05-04 ENCOUNTER — Ambulatory Visit (INDEPENDENT_AMBULATORY_CARE_PROVIDER_SITE_OTHER): Payer: Medicare Other | Admitting: Urology

## 2020-05-04 ENCOUNTER — Other Ambulatory Visit: Payer: Self-pay

## 2020-05-04 ENCOUNTER — Encounter: Payer: Self-pay | Admitting: Urology

## 2020-05-04 VITALS — BP 122/73 | HR 78 | Temp 97.4°F | Ht 67.0 in | Wt 160.0 lb

## 2020-05-04 DIAGNOSIS — N390 Urinary tract infection, site not specified: Secondary | ICD-10-CM | POA: Insufficient documentation

## 2020-05-04 DIAGNOSIS — R32 Unspecified urinary incontinence: Secondary | ICD-10-CM | POA: Diagnosis not present

## 2020-05-04 LAB — URINALYSIS, ROUTINE W REFLEX MICROSCOPIC
Bilirubin, UA: NEGATIVE
Glucose, UA: NEGATIVE
Ketones, UA: NEGATIVE
Nitrite, UA: NEGATIVE
Protein,UA: NEGATIVE
Specific Gravity, UA: 1.02 (ref 1.005–1.030)
Urobilinogen, Ur: 0.2 mg/dL (ref 0.2–1.0)
pH, UA: 5.5 (ref 5.0–7.5)

## 2020-05-04 LAB — MICROSCOPIC EXAMINATION
Renal Epithel, UA: NONE SEEN /hpf
WBC, UA: >30 /hpf — AB (ref 0–5)

## 2020-05-04 LAB — BLADDER SCAN AMB NON-IMAGING: Scan Result: 70

## 2020-05-04 MED ORDER — NITROFURANTOIN MONOHYD MACRO 100 MG PO CAPS: 100.0000 mg | ORAL_CAPSULE | Freq: Two times a day (BID) | ORAL | 0 refills | Status: DC

## 2020-05-04 MED ORDER — MIRABEGRON ER 25 MG PO TB24: 25.0000 mg | ORAL_TABLET | Freq: Every day | ORAL | 0 refills | Status: DC

## 2020-05-04 NOTE — Patient Instructions (Signed)

## 2020-05-04 NOTE — Progress Notes (Signed)
05/04/2020 1:26 PM   KAARIN PARDY February 23, 1941 [id number]  Referring provider: Earney Mallet, MD [address] [place] [address]  Urinary incontinence and recurrent UTI  HPI: Jaime Weber is a 79yo here for evaluation of urinary incontinence and recurrent UTI. She has gotten 2 UTIs in the past 3 months. She has urge urinary incontinence and uses 5-6 pads during the day and uses 4 pads at night. She has a hx of of sacrocolpopexy and rectocele in 2020.    PMH: Past Medical History:  Diagnosis Date  . Atypical mole 09/10/2013   left anterior knee mild  . Borderline glaucoma   . Constipation   . Lesion of bladder   . PONV (postoperative nausea and vomiting)   . Prolapse of female pelvic organs   . Rectocele   . Wears glasses     Surgical History: Past Surgical History:  Procedure Laterality Date  . BREAST BIOPSY    . cataract bilateral   approx may 2019 then 2019,  . COLONOSCOPY  2014   [place], negative per patient  . CYSTOSCOPY N/A 03/15/2019   Procedure: CYSTOSCOPY;  Surgeon: Bjorn Loser, MD;  Location: [facility];  Service: Urology;  Laterality: N/A;  . CYSTOSCOPY W/ RETROGRADES Bilateral 07/13/2015   Procedure: CYSTOSCOPY WITH BILATERAL RETROGRADE PYELOGRAM;  Surgeon: Cleon Gustin, MD;  Location: [facility];  Service: Urology;  Laterality: Bilateral;  . CYSTOSCOPY WITH BIOPSY N/A 07/13/2015   Procedure: CYSTOSCOPY WITH BIOPSY WITH SELECTIVE CYTOLOGIES;  Surgeon: Cleon Gustin, MD;  Location: [facility];  Service: Urology;  Laterality: N/A;  . ORIF RADIAL FRACTURE  07/05/2011   Procedure: OPEN REDUCTION INTERNAL FIXATION (ORIF) RADIAL FRACTURE;  Surgeon: Cammie Sickle., MD;  Location: [facility];  Service: Orthopedics;  Laterality: Left;  . PUBOVAGINAL SLING N/A 03/15/2019   Procedure: Gaynelle Arabian;  Surgeon: Bjorn Loser, MD;  Location: [facility];  Service: Urology;  Laterality: N/A;  . ROBOTIC  ASSISTED LAPAROSCOPIC SACROCOLPOPEXY N/A 03/15/2019   Procedure: ROBOTIC ASSISTED LAPAROSCOPIC SACROCOLPOPEXY;  Surgeon: Ardis Hughs, MD;  Location: [facility];  Service: Urology;  Laterality: N/A;  . TONSILLECTOMY  as child  . VAGINAL HYSTERECTOMY  1980's    Home Medications:  Allergies as of 05/04/2020      Reactions   Cephalosporins Rash   Penicillins Rash   Did it involve swelling of the face/tongue/throat, SOB, or low BP? No Did it involve sudden or severe rash/hives, skin peeling, or any reaction on the inside of your mouth or nose? no Did you need to seek medical attention at a hospital or doctor's office? No When did it last happen? Many years ago If all above answers are "NO", may proceed with cephalosporin use.   Sulfa Antibiotics Rash   Adhesive [tape] Rash   Benadryl [diphenhydramine Hcl] Rash   Hydrocodone Rash   Prednisone Itching, Rash      Medication List       Accurate as of May 04, 2020  1:26 PM. If you have any questions, ask your nurse or doctor.        ciprofloxacin 500 MG tablet Commonly known as: CIPRO   clobetasol 0.05 % external solution Commonly known as: TEMOVATE Apply 1 application topically 2 (two) times daily.   escitalopram 10 MG tablet Commonly known as: LEXAPRO Take 10 mg by mouth daily.   estradiol 0.1 MG/GM vaginal cream Commonly known as: ESTRACE VAGINAL Apply one pea sized dolyp to  index finger and apply just inside the vaginal opening every other day.   MAGNESIUM PO Take by mouth daily. Magnesium Optimizer   MAXI-CALM PO Take by mouth daily. Calm supplement for constipation   MULTI-ENZYME PO Take by mouth daily. Whole enzyme supplement   nitrofurantoin (macrocrystal-monohydrate) 100 MG capsule Commonly known as: Macrobid Take 1 capsule (100 mg total) by mouth 2 (two) times daily.   ONE-A-DAY BONE STRENGTH PO Take by mouth daily.   SUPER OMEGA 3 PO Take by mouth daily.   traMADol 50 MG tablet Commonly  known as: Ultram Take 1-2 tablets (50-100 mg total) by mouth every 6 (six) hours as needed for moderate pain.   ULTRAFLORA IMMUNE HEALTH PO Take by mouth daily. Ultra-Flora Balance supplement   vitamin C 1000 MG tablet Take 1,000 mg by mouth daily.   VITAMIN D-3 PO Take by mouth daily.       Allergies:  Allergies  Allergen Reactions  . Cephalosporins Rash  . Penicillins Rash    Did it involve swelling of the face/tongue/throat, SOB, or low BP? No Did it involve sudden or severe rash/hives, skin peeling, or any reaction on the inside of your mouth or nose? no Did you need to seek medical attention at a hospital or doctor's office? No When did it last happen? Many years ago If all above answers are "NO", may proceed with cephalosporin use.   . Sulfa Antibiotics Rash  . Adhesive [Tape] Rash  . Benadryl [Diphenhydramine Hcl] Rash  . Hydrocodone Rash  . Prednisone Itching and Rash    Family History: Family History  Problem Relation Age of Onset  . Cancer Father        ORAL   . Heart attack Son        deceased  . Breast cancer Neg Hx     Social History:  reports that she has never smoked. She has never used smokeless tobacco. She reports previous alcohol use. She reports that she does not use drugs.  ROS: All other review of systems were reviewed and are negative except what is noted above in HPI  Physical Exam: BP 122/73   Pulse 78   Temp (!) 97.4 F (36.3 C)   Ht 5\' 7"  (1.702 m)   Wt 160 lb (72.6 kg)   BMI 25.06 kg/m   Constitutional:  Alert and oriented, No acute distress. HEENT: [place] AT, moist mucus membranes.  Trachea midline, no masses. Cardiovascular: No clubbing, cyanosis, or edema. Respiratory: Normal respiratory effort, no increased work of breathing. GI: Abdomen is soft, nontender, nondistended, no abdominal masses GU: No CVA tenderness.  Lymph: No cervical or inguinal lymphadenopathy. Skin: No rashes, bruises or suspicious  lesions. Neurologic: Grossly intact, no focal deficits, moving all 4 extremities. Psychiatric: Normal mood and affect.  Laboratory Data: Lab Results  Component Value Date   WBC 9.1 03/12/2019   HGB 11.4 (L) 03/16/2019   HCT 35.8 (L) 03/16/2019   MCV 96.2 03/12/2019   PLT 247 03/12/2019    Lab Results  Component Value Date   CREATININE 0.68 03/16/2019    No results found for: PSA  No results found for: TESTOSTERONE  No results found for: HGBA1C  Urinalysis No results found for: COLORURINE, APPEARANCEUR, LABSPEC, PHURINE, GLUCOSEU, HGBUR, BILIRUBINUR, KETONESUR, PROTEINUR, UROBILINOGEN, NITRITE, LEUKOCYTESUR  No results found for: LABMICR, [place], RBCUA, LABEPIT, MUCUS, BACTERIA  Pertinent Imaging:  No results found for this or any previous visit.  No results found for this or any previous visit.  No results found for this or any previous visit.  No results found for this or any previous visit.  No results found for this or any previous visit.  No results found for this or any previous visit.  No results found for this or any previous visit.  No results found for this or any previous visit.   Assessment & Plan:    1. Recurrent UTI -Urine for culture, will call with results -macrobid 100mg  BID for 7 days. We will start macrobid 50mg  after she has finished the 100mg  course - Urinalysis, Routine w reflex microscopic - BLADDER SCAN AMB NON-IMAGING  2. Urinary incontinence in female -We will trial mirabegron 25mg     No follow-ups on file.  Nicolette Bang, MD  [facility] [place]

## 2020-05-04 NOTE — Progress Notes (Signed)
Bladder Scan Patient can void: 70 ml Performed By: Durenda Guthrie, LPN   Urological Symptom Review  Patient is experiencing the following symptoms: Frequent urination Hard to postpone urination Burning/pain with urination Get up at night to urinate Leakage of urine Stream starts and stops Trouble starting stream Have to strain to urinate Blood in urine Weak stream  Wets the bed   Review of Systems  Gastrointestinal (upper)  : Negative for upper GI symptoms  Gastrointestinal (lower) : Constipation  Constitutional : Negative for symptoms  Skin: Negative for skin symptoms  Eyes: Negative for eye symptoms  Ear/Nose/Throat : Negative for Ear/Nose/Throat symptoms  Hematologic/Lymphatic: Negative for Hematologic/Lymphatic symptoms  Cardiovascular : Leg swelling  Respiratory : Negative for respiratory symptoms  Endocrine: Negative for endocrine symptoms  Musculoskeletal: Back pain  Neurological: Negative for neurological symptoms  Psychologic: Negative for psychiatric symptoms

## 2020-05-10 LAB — URINE CULTURE

## 2020-05-11 ENCOUNTER — Other Ambulatory Visit: Payer: Self-pay

## 2020-05-11 ENCOUNTER — Telehealth: Payer: Self-pay

## 2020-05-11 DIAGNOSIS — N390 Urinary tract infection, site not specified: Secondary | ICD-10-CM

## 2020-05-11 MED ORDER — NITROFURANTOIN MACROCRYSTAL 50 MG PO CAPS: 50.0000 mg | ORAL_CAPSULE | Freq: Every day | ORAL | 11 refills | Status: DC

## 2020-05-11 NOTE — Telephone Encounter (Signed)
-----   Message from Cleon Gustin, MD sent at 05/11/2020 11:00 AM EST ----- [place] please have her take the macrobid ----- Message ----- From: Iris Pert, LPN Sent: [id number]   8:26 AM EST To: Cleon Gustin, MD  Please review  Current order   macrobid 100mg  BID for 7 days. She  will start macrobid 50mg  after she has finished the 100mg  course

## 2020-05-11 NOTE — Telephone Encounter (Signed)
Pt called and made aware

## 2020-05-25 ENCOUNTER — Telehealth: Payer: Self-pay | Admitting: Urology

## 2020-05-25 NOTE — Telephone Encounter (Signed)
Pt called saying she ran out of med that Dr. Alyson Ingles gave her samples of. I have looked in notes and do not see anything about the samples. Pt was not at home and does not know name of med. Will call us back when able to with name.

## 2020-05-25 NOTE — Telephone Encounter (Signed)
Pt called and Lm regarding a med refill. She asked that a nurse return her call.

## 2020-05-26 ENCOUNTER — Other Ambulatory Visit: Payer: Self-pay | Admitting: Nurse Practitioner

## 2020-05-26 DIAGNOSIS — Z1231 Encounter for screening mammogram for malignant neoplasm of breast: Secondary | ICD-10-CM

## 2020-05-26 NOTE — Telephone Encounter (Signed)
Pt called back wanting samples of Myrbetriq again. We are out of the 25mg  so Dr. Alyson Ingles gave permission to give pt 50mg  to be split. With 1/2  tab to be taken per day. Pt notified and samples left at front.

## 2020-06-10 ENCOUNTER — Encounter: Payer: Self-pay | Admitting: Urology

## 2020-06-10 ENCOUNTER — Ambulatory Visit (INDEPENDENT_AMBULATORY_CARE_PROVIDER_SITE_OTHER): Payer: Medicare Other | Admitting: Urology

## 2020-06-10 ENCOUNTER — Other Ambulatory Visit: Payer: Self-pay

## 2020-06-10 VITALS — BP 130/73 | HR 76 | Temp 98.0°F | Ht 67.0 in | Wt 160.0 lb

## 2020-06-10 DIAGNOSIS — R32 Unspecified urinary incontinence: Secondary | ICD-10-CM

## 2020-06-10 DIAGNOSIS — N390 Urinary tract infection, site not specified: Secondary | ICD-10-CM | POA: Diagnosis not present

## 2020-06-10 LAB — URINALYSIS, ROUTINE W REFLEX MICROSCOPIC
Bilirubin, UA: NEGATIVE
Glucose, UA: NEGATIVE
Ketones, UA: NEGATIVE
Nitrite, UA: NEGATIVE
Protein,UA: NEGATIVE
Specific Gravity, UA: 1.015 (ref 1.005–1.030)
Urobilinogen, Ur: 0.2 mg/dL (ref 0.2–1.0)
pH, UA: 6 (ref 5.0–7.5)

## 2020-06-10 LAB — MICROSCOPIC EXAMINATION: Renal Epithel, UA: NONE SEEN /hpf

## 2020-06-10 LAB — BLADDER SCAN AMB NON-IMAGING: Scan Result: 194

## 2020-06-10 MED ORDER — NITROFURANTOIN MACROCRYSTAL 50 MG PO CAPS: 50.0000 mg | ORAL_CAPSULE | Freq: Every day | ORAL | 11 refills | Status: DC

## 2020-06-10 MED ORDER — FESOTERODINE FUMARATE ER 4 MG PO TB24: 4.0000 mg | ORAL_TABLET | Freq: Every day | ORAL | 0 refills | Status: DC

## 2020-06-10 NOTE — Progress Notes (Signed)
06/10/2020 2:27 PM   Jaime Weber Feb 11, 1941 [id number]  Referring provider: William Dalton, [place] [address]  followup recurrent UTI and OAB  HPI: Jaime Weber is a 79yo here for followup for recurrent UTI and OAB. She is on mirabegron 25mg  which failed to improve her nocturia or daytime frequency and incontinence. She is taking macrobid 50mg  for prophylaxis. No UTIs since last visit.  She continues to use 3-5 pads per day and has nocturnal enuresis. She is unhappy with her urination   PMH: Past Medical History:  Diagnosis Date  . Atypical mole 09/10/2013   left anterior knee mild  . Borderline glaucoma   . Constipation   . Lesion of bladder   . PONV (postoperative nausea and vomiting)   . Prolapse of female pelvic organs   . Rectocele   . Wears glasses     Surgical History: Past Surgical History:  Procedure Laterality Date  . BREAST BIOPSY    . cataract bilateral   approx may 2019 then 2019,  . COLONOSCOPY  2014   [place], negative per patient  . CYSTOSCOPY N/A 03/15/2019   Procedure: CYSTOSCOPY;  Surgeon: Bjorn Loser, MD;  Location: [facility];  Service: Urology;  Laterality: N/A;  . CYSTOSCOPY W/ RETROGRADES Bilateral 07/13/2015   Procedure: CYSTOSCOPY WITH BILATERAL RETROGRADE PYELOGRAM;  Surgeon: Cleon Gustin, MD;  [facility];  Service: Urology;  Laterality: Bilateral;  . CYSTOSCOPY WITH BIOPSY N/A 07/13/2015   Procedure: CYSTOSCOPY WITH BIOPSY WITH SELECTIVE CYTOLOGIES;  Surgeon: Cleon Gustin, MD;  Location: [facility];  Service: Urology;  Laterality: N/A;  . ORIF RADIAL FRACTURE  07/05/2011   Procedure: OPEN REDUCTION INTERNAL FIXATION (ORIF) RADIAL FRACTURE;  Surgeon: Cammie Sickle., MD;  Location: [facility];  Service: Orthopedics;  Laterality: Left;  . PUBOVAGINAL SLING N/A 03/15/2019   Procedure: Gaynelle Arabian;  Surgeon: Bjorn Loser, MD;   Location: [facility];  Service: Urology;  Laterality: N/A;  . ROBOTIC ASSISTED LAPAROSCOPIC SACROCOLPOPEXY N/A 03/15/2019   Procedure: ROBOTIC ASSISTED LAPAROSCOPIC SACROCOLPOPEXY;  Surgeon: Ardis Hughs, MD;  Location: [facility];  Service: Urology;  Laterality: N/A;  . TONSILLECTOMY  as child  . VAGINAL HYSTERECTOMY  1980's    Home Medications:  Allergies as of 06/10/2020      Reactions   Cephalosporins Rash   Penicillins Rash   Did it involve swelling of the face/tongue/throat, SOB, or low BP? No Did it involve sudden or severe rash/hives, skin peeling, or any reaction on the inside of your mouth or nose? no Did you need to seek medical attention at a hospital or doctor's office? No When did it last happen? Many years ago If all above answers are "NO", may proceed with cephalosporin use.   Sulfa Antibiotics Rash   Adhesive [tape] Rash   Benadryl [diphenhydramine Hcl] Rash   Hydrocodone Rash   Prednisone Itching, Rash      Medication List       Accurate as of June 10, 2020  2:27 PM. If you have any questions, ask your nurse or doctor.        STOP taking these medications   ciprofloxacin 500 MG tablet Commonly known as: CIPRO Stopped by: Nicolette Bang, MD   estradiol 0.1 MG/GM vaginal cream Commonly known as: ESTRACE VAGINAL Stopped by: Nicolette Bang, MD     TAKE these medications   clobetasol 0.05 % external solution Commonly known as: TEMOVATE  Apply 1 application topically 2 (two) times daily.   escitalopram 10 MG tablet Commonly known as: LEXAPRO Take 10 mg by mouth daily.   MAGNESIUM PO Take by mouth daily. Magnesium Optimizer   MAXI-CALM PO Take by mouth daily. Calm supplement for constipation   mirabegron ER 25 MG Tb24 tablet Commonly known as: MYRBETRIQ Take 1 tablet (25 mg total) by mouth daily.   MULTI-ENZYME PO Take by mouth daily. Whole enzyme supplement   nitrofurantoin (macrocrystal-monohydrate) 100 MG capsule Commonly known  as: Macrobid Take 1 capsule (100 mg total) by mouth 2 (two) times daily.   nitrofurantoin 50 MG capsule Commonly known as: MACRODANTIN Take 1 capsule (50 mg total) by mouth at bedtime.   ONE-A-DAY BONE STRENGTH PO Take by mouth daily.   SUPER OMEGA 3 PO Take by mouth daily.   traMADol 50 MG tablet Commonly known as: Ultram Take 1-2 tablets (50-100 mg total) by mouth every 6 (six) hours as needed for moderate pain.   ULTRAFLORA IMMUNE HEALTH PO Take by mouth daily. Ultra-Flora Balance supplement   vitamin C 1000 MG tablet Take 1,000 mg by mouth daily.   VITAMIN D-3 PO Take by mouth daily.       Allergies:  Allergies  Allergen Reactions  . Cephalosporins Rash  . Penicillins Rash    Did it involve swelling of the face/tongue/throat, SOB, or low BP? No Did it involve sudden or severe rash/hives, skin peeling, or any reaction on the inside of your mouth or nose? no Did you need to seek medical attention at a hospital or doctor's office? No When did it last happen? Many years ago If all above answers are "NO", may proceed with cephalosporin use.   . Sulfa Antibiotics Rash  . Adhesive [Tape] Rash  . Benadryl [Diphenhydramine Hcl] Rash  . Hydrocodone Rash  . Prednisone Itching and Rash    Family History: Family History  Problem Relation Age of Onset  . Cancer Father        ORAL   . Heart attack Son        deceased  . Breast cancer Neg Hx     Social History:  reports that she has never smoked. She has never used smokeless tobacco. She reports previous alcohol use. She reports that she does not use drugs.  ROS: All other review of systems were reviewed and are negative except what is noted above in HPI  Physical Exam: Ht 5\' 7"  (1.702 m)   Wt 160 lb (72.6 kg)   BMI 25.06 kg/m   Constitutional:  Alert and oriented, No acute distress. HEENT: [place] AT, moist mucus membranes.  Trachea midline, no masses. Cardiovascular: No clubbing, cyanosis, or  edema. Respiratory: Normal respiratory effort, no increased work of breathing. GI: Abdomen is soft, nontender, nondistended, no abdominal masses GU: No CVA tenderness.  Lymph: No cervical or inguinal lymphadenopathy. Skin: No rashes, bruises or suspicious lesions. Neurologic: Grossly intact, no focal deficits, moving all 4 extremities. Psychiatric: Normal mood and affect.  Laboratory Data: Lab Results  Component Value Date   WBC 9.1 03/12/2019   HGB 11.4 (L) 03/16/2019   HCT 35.8 (L) 03/16/2019   MCV 96.2 03/12/2019   PLT 247 03/12/2019    Lab Results  Component Value Date   CREATININE 0.68 03/16/2019    No results found for: PSA  No results found for: TESTOSTERONE  No results found for: HGBA1C  Urinalysis    Component Value Date/Time   APPEARANCEUR Clear 05/04/2020 1332  GLUCOSEU Negative 05/04/2020 1332   BILIRUBINUR Negative 05/04/2020 1332   PROTEINUR Negative 05/04/2020 1332   NITRITE Negative 05/04/2020 1332   LEUKOCYTESUR 2+ (A) 05/04/2020 1332    Lab Results  Component Value Date   LABMICR See below: 05/04/2020   WBCUA >30 (A) 05/04/2020   LABEPIT 0-10 05/04/2020   BACTERIA Many (A) 05/04/2020    Pertinent Imaging:  No results found for this or any previous visit.  No results found for this or any previous visit.  No results found for this or any previous visit.  No results found for this or any previous visit.  No results found for this or any previous visit.  No results found for this or any previous visit.  No results found for this or any previous visit.  No results found for this or any previous visit.   Assessment & Plan:    1. Recurrent UTI -continue macrobid 50mg  qhs - Urinalysis, Routine w reflex microscopic  2. Urinary incontinence in female Toviaz 4mg  daily   No follow-ups on file.  Nicolette Bang, MD  [facility] [place]

## 2020-06-10 NOTE — Patient Instructions (Signed)

## 2020-06-10 NOTE — Progress Notes (Signed)
Bladder Scan Patient can void: 194 ml Performed By: Demyah Smyre,lpn    Urological Symptom Review  Patient is experiencing the following symptoms: Hard to postpone urination Get up at night to urinate   Review of Systems  Gastrointestinal (upper)  : Negative for upper GI symptoms  Gastrointestinal (lower) : Constipation  Constitutional : Negative for symptoms  Skin: Negative for skin symptoms  Eyes: Negative for eye symptoms  Ear/Nose/Throat : Negative for Ear/Nose/Throat symptoms  Hematologic/Lymphatic: Negative for Hematologic/Lymphatic symptoms  Cardiovascular : Negative for cardiovascular symptoms  Respiratory : Negative for respiratory symptoms  Endocrine: Negative for endocrine symptoms  Musculoskeletal: Negative for musculoskeletal symptoms  Neurological: Negative for neurological symptoms  Psychologic: Negative for psychiatric symptoms

## 2020-06-18 ENCOUNTER — Telehealth: Payer: Self-pay

## 2020-06-18 ENCOUNTER — Other Ambulatory Visit: Payer: Self-pay

## 2020-06-18 DIAGNOSIS — N390 Urinary tract infection, site not specified: Secondary | ICD-10-CM

## 2020-06-18 MED ORDER — NITROFURANTOIN MONOHYD MACRO 100 MG PO CAPS: 100.0000 mg | ORAL_CAPSULE | Freq: Two times a day (BID) | ORAL | 0 refills | Status: DC

## 2020-06-18 NOTE — Progress Notes (Signed)
Pt called asking about urine culture result- urine culture. Urine culture was not ordered= Dr. Alyson Ingles is aware and sent antibiotic for macrobid 100mg  po BID. Medication sent to pharmacy. Pt will come by Tuesday to leave urine culture and hold medication unless she feels she needs it.

## 2020-06-23 ENCOUNTER — Other Ambulatory Visit: Payer: Medicare Other

## 2020-06-24 ENCOUNTER — Other Ambulatory Visit: Payer: Self-pay

## 2020-06-25 ENCOUNTER — Telehealth: Payer: Self-pay

## 2020-06-25 ENCOUNTER — Other Ambulatory Visit: Payer: Self-pay

## 2020-06-25 DIAGNOSIS — N390 Urinary tract infection, site not specified: Secondary | ICD-10-CM

## 2020-06-25 NOTE — Progress Notes (Unsigned)
urine

## 2020-07-01 LAB — URINALYSIS, COMPLETE
Bilirubin, UA: NEGATIVE
Glucose, UA: NEGATIVE
Ketones, UA: NEGATIVE
Nitrite, UA: NEGATIVE
Protein,UA: NEGATIVE
RBC, UA: NEGATIVE
Specific Gravity, UA: 1.012 (ref 1.005–1.030)
Urobilinogen, Ur: 0.2 mg/dL (ref 0.2–1.0)
pH, UA: 6.5 (ref 5.0–7.5)

## 2020-07-01 LAB — MICROSCOPIC EXAMINATION
Bacteria, UA: NONE SEEN
Casts: NONE SEEN /lpf
RBC, Urine: NONE SEEN /hpf (ref 0–2)

## 2020-07-01 LAB — CULTURE, URINE COMPREHENSIVE

## 2020-07-06 ENCOUNTER — Telehealth: Payer: Self-pay

## 2020-07-06 NOTE — Telephone Encounter (Signed)
Pt made aware per Dr. Alyson Ingles verbally culture is negative.

## 2020-07-07 NOTE — Telephone Encounter (Signed)
Opened in error

## 2020-07-08 ENCOUNTER — Ambulatory Visit: Payer: Medicare Other

## 2020-07-17 ENCOUNTER — Other Ambulatory Visit: Payer: Self-pay

## 2020-07-17 ENCOUNTER — Ambulatory Visit (INDEPENDENT_AMBULATORY_CARE_PROVIDER_SITE_OTHER): Payer: Medicare Other | Admitting: Urology

## 2020-07-17 VITALS — BP 103/69 | HR 85 | Temp 98.3°F | Ht 67.0 in | Wt 160.0 lb

## 2020-07-17 DIAGNOSIS — N390 Urinary tract infection, site not specified: Secondary | ICD-10-CM | POA: Diagnosis not present

## 2020-07-17 DIAGNOSIS — R32 Unspecified urinary incontinence: Secondary | ICD-10-CM

## 2020-07-17 LAB — URINALYSIS, ROUTINE W REFLEX MICROSCOPIC
Bilirubin, UA: NEGATIVE
Glucose, UA: NEGATIVE
Ketones, UA: NEGATIVE
Nitrite, UA: NEGATIVE
Protein,UA: NEGATIVE
Specific Gravity, UA: 1.015 (ref 1.005–1.030)
Urobilinogen, Ur: 0.2 mg/dL (ref 0.2–1.0)
pH, UA: 5 (ref 5.0–7.5)

## 2020-07-17 LAB — MICROSCOPIC EXAMINATION
Epithelial Cells (non renal): >10 /hpf — AB (ref 0–10)
Renal Epithel, UA: NONE SEEN /hpf

## 2020-07-17 LAB — BLADDER SCAN AMB NON-IMAGING: Scan Result: 159

## 2020-07-17 MED ORDER — FESOTERODINE FUMARATE ER 4 MG PO TB24: 4.0000 mg | ORAL_TABLET | Freq: Every day | ORAL | 11 refills | Status: DC

## 2020-07-17 MED ORDER — NITROFURANTOIN MACROCRYSTAL 50 MG PO CAPS: 50.0000 mg | ORAL_CAPSULE | Freq: Every day | ORAL | 11 refills | Status: DC

## 2020-07-17 NOTE — Progress Notes (Signed)
07/17/2020 2:49 PM   Jaime Weber Nov 18, 1940 [id number]  Referring provider: William Dalton, [place] [address]  Followup urinary incontinence and recurrent UTI  HPI: Jaime Weber is a 80yo here for followup for recurrent UTI and OAB. She is on toviaz 4mg . Incontinence resolved. She is no longer wearing pads. Nocturia 1x. She is happy with her urination. NO UTI since last vist.    PMH: Past Medical History:  Diagnosis Date  . Atypical mole 09/10/2013   left anterior knee mild  . Borderline glaucoma   . Constipation   . Lesion of bladder   . PONV (postoperative nausea and vomiting)   . Prolapse of female pelvic organs   . Rectocele   . Wears glasses     Surgical History: Past Surgical History:  Procedure Laterality Date  . BREAST BIOPSY    . cataract bilateral   approx may 2019 then 2019,  . COLONOSCOPY  2014   [place], negative per patient  . CYSTOSCOPY N/A 03/15/2019   Procedure: CYSTOSCOPY;  Surgeon: Bjorn Loser, MD;  Location: [facility];  Service: Urology;  Laterality: N/A;  . CYSTOSCOPY W/ RETROGRADES Bilateral 07/13/2015   Procedure: CYSTOSCOPY WITH BILATERAL RETROGRADE PYELOGRAM;  Surgeon: Cleon Gustin, MD;  Location: [facility];  Service: Urology;  Laterality: Bilateral;  . CYSTOSCOPY WITH BIOPSY N/A 07/13/2015   Procedure: CYSTOSCOPY WITH BIOPSY WITH SELECTIVE CYTOLOGIES;  Surgeon: Cleon Gustin, MD;  Location: [facility];  Service: Urology;  Laterality: N/A;  . ORIF RADIAL FRACTURE  07/05/2011   Procedure: OPEN REDUCTION INTERNAL FIXATION (ORIF) RADIAL FRACTURE;  Surgeon: Cammie Sickle., MD;  Location: [facility];  Service: Orthopedics;  Laterality: Left;  . PUBOVAGINAL SLING N/A 03/15/2019   Procedure: Gaynelle Arabian;  Surgeon: Bjorn Loser, MD;  Location: [facility];  Service: Urology;  Laterality: N/A;  . ROBOTIC ASSISTED LAPAROSCOPIC SACROCOLPOPEXY N/A  03/15/2019   Procedure: ROBOTIC ASSISTED LAPAROSCOPIC SACROCOLPOPEXY;  Surgeon: Ardis Hughs, MD;  Location: [facility];  Service: Urology;  Laterality: N/A;  . TONSILLECTOMY  as child  . VAGINAL HYSTERECTOMY  1980's    Home Medications:  Allergies as of 07/17/2020      Reactions   Cephalosporins Rash   Penicillins Rash   Did it involve swelling of the face/tongue/throat, SOB, or low BP? No Did it involve sudden or severe rash/hives, skin peeling, or any reaction on the inside of your mouth or nose? no Did you need to seek medical attention at a hospital or doctor's office? No When did it last happen? Many years ago If all above answers are "NO", may proceed with cephalosporin use.   Sulfa Antibiotics Rash   Adhesive [tape] Rash   Benadryl [diphenhydramine Hcl] Rash   Hydrocodone Rash   Prednisone Itching, Rash      Medication List       Accurate as of July 17, 2020  2:49 PM. If you have any questions, ask your nurse or doctor.        clobetasol 0.05 % external solution Commonly known as: TEMOVATE Apply 1 application topically 2 (two) times daily.   escitalopram 10 MG tablet Commonly known as: LEXAPRO Take 10 mg by mouth daily.   fesoterodine 4 MG Tb24 tablet Commonly known as: TOVIAZ Take 1 tablet (4 mg total) by mouth daily.   MAGNESIUM PO Take by mouth daily. Magnesium Optimizer   MAXI-CALM PO Take by mouth daily. Calm supplement  for constipation   mirabegron ER 25 MG Tb24 tablet Commonly known as: MYRBETRIQ Take 1 tablet (25 mg total) by mouth daily.   MULTI-ENZYME PO Take by mouth daily. Whole enzyme supplement   nitrofurantoin (macrocrystal-monohydrate) 100 MG capsule Commonly known as: MACROBID Take 1 capsule (100 mg total) by mouth every 12 (twelve) hours.   nitrofurantoin 50 MG capsule Commonly known as: MACRODANTIN Take 1 capsule (50 mg total) by mouth at bedtime.   ONE-A-DAY BONE STRENGTH PO Take by mouth daily.   SUPER OMEGA 3  PO Take by mouth daily.   traMADol 50 MG tablet Commonly known as: Ultram Take 1-2 tablets (50-100 mg total) by mouth every 6 (six) hours as needed for moderate pain.   ULTRAFLORA IMMUNE HEALTH PO Take by mouth daily. Ultra-Flora Balance supplement   vitamin C 1000 MG tablet Take 1,000 mg by mouth daily.   VITAMIN D-3 PO Take by mouth daily.       Allergies:  Allergies  Allergen Reactions  . Cephalosporins Rash  . Penicillins Rash    Did it involve swelling of the face/tongue/throat, SOB, or low BP? No Did it involve sudden or severe rash/hives, skin peeling, or any reaction on the inside of your mouth or nose? no Did you need to seek medical attention at a hospital or doctor's office? No When did it last happen? Many years ago If all above answers are "NO", may proceed with cephalosporin use.   . Sulfa Antibiotics Rash  . Adhesive [Tape] Rash  . Benadryl [Diphenhydramine Hcl] Rash  . Hydrocodone Rash  . Prednisone Itching and Rash    Family History: Family History  Problem Relation Age of Onset  . Cancer Father        ORAL   . Heart attack Son        deceased  . Breast cancer Neg Hx     Social History:  reports that she has never smoked. She has never used smokeless tobacco. She reports previous alcohol use. She reports that she does not use drugs.  ROS: All other review of systems were reviewed and are negative except what is noted above in HPI  Physical Exam: BP 103/69   Pulse 85   Temp 98.3 F (36.8 C)   Ht 5\' 7"  (1.702 m)   Wt 160 lb (72.6 kg)   BMI 25.06 kg/m   Constitutional:  Alert and oriented, No acute distress. HEENT: [place] AT, moist mucus membranes.  Trachea midline, no masses. Cardiovascular: No clubbing, cyanosis, or edema. Respiratory: Normal respiratory effort, no increased work of breathing. GI: Abdomen is soft, nontender, nondistended, no abdominal masses GU: No CVA tenderness.  Lymph: No cervical or inguinal  lymphadenopathy. Skin: No rashes, bruises or suspicious lesions. Neurologic: Grossly intact, no focal deficits, moving all 4 extremities. Psychiatric: Normal mood and affect.  Laboratory Data: Lab Results  Component Value Date   WBC 9.1 03/12/2019   HGB 11.4 (L) 03/16/2019   HCT 35.8 (L) 03/16/2019   MCV 96.2 03/12/2019   PLT 247 03/12/2019    Lab Results  Component Value Date   CREATININE 0.68 03/16/2019    No results found for: PSA  No results found for: TESTOSTERONE  No results found for: HGBA1C  Urinalysis    Component Value Date/Time   APPEARANCEUR Clear 06/25/2020 1559   GLUCOSEU Negative 06/25/2020 1559   BILIRUBINUR Negative 06/25/2020 1559   PROTEINUR Negative 06/25/2020 1559   NITRITE Negative 06/25/2020 1559   LEUKOCYTESUR 1+ (A) 06/25/2020  69    Lab Results  Component Value Date   LABMICR See below: 06/25/2020   WBCUA 0-5 06/25/2020   LABEPIT 0-10 06/25/2020   BACTERIA None seen 06/25/2020    Pertinent Imaging:  No results found for this or any previous visit.  No results found for this or any previous visit.  No results found for this or any previous visit.  No results found for this or any previous visit.  No results found for this or any previous visit.  No results found for this or any previous visit.  No results found for this or any previous visit.  No results found for this or any previous visit.   Assessment & Plan:    1. Recurrent UTI -continue macrobid 50mg  qhs - Urinalysis, Routine w reflex microscopic - BLADDER SCAN AMB NON-IMAGING - nitrofurantoin (MACRODANTIN) 50 MG capsule; Take 1 capsule (50 mg total) by mouth at bedtime.  Dispense: 30 capsule; Refill: 11  2. Urinary incontinence in female -continue toviaz 4mg  daily - fesoterodine (TOVIAZ) 4 MG TB24 tablet; Take 1 tablet (4 mg total) by mouth daily.  Dispense: 30 tablet; Refill: 11   No follow-ups on file.  Nicolette Bang, MD  [facility]  [place]

## 2020-07-17 NOTE — Progress Notes (Signed)
Bladder Scan Patient can void: 159 ml Performed By: Sayf Kerner,lpn    Urological Symptom Review  Patient is experiencing the following symptoms: Get up at night to urinate   Review of Systems  Gastrointestinal (upper)  : Negative for upper GI symptoms  Gastrointestinal (lower) : Negative for lower GI symptoms  Constitutional : Negative for symptoms  Skin: Negative for skin symptoms  Eyes: Negative for eye symptoms  Ear/Nose/Throat : Negative for Ear/Nose/Throat symptoms  Hematologic/Lymphatic: Negative for Hematologic/Lymphatic symptoms  Cardiovascular : Negative for cardiovascular symptoms  Respiratory : Negative for respiratory symptoms  Endocrine: Negative for endocrine symptoms  Musculoskeletal: Negative for musculoskeletal symptoms  Neurological: Negative for neurological symptoms  Psychologic: Negative for psychiatric symptoms

## 2020-07-21 ENCOUNTER — Other Ambulatory Visit: Payer: Self-pay | Admitting: Urology

## 2020-07-21 ENCOUNTER — Other Ambulatory Visit: Payer: Self-pay

## 2020-07-21 ENCOUNTER — Encounter: Payer: Self-pay | Admitting: Urology

## 2020-07-21 DIAGNOSIS — R32 Unspecified urinary incontinence: Secondary | ICD-10-CM

## 2020-07-21 MED ORDER — FESOTERODINE FUMARATE ER 4 MG PO TB24: 4.0000 mg | ORAL_TABLET | Freq: Every day | ORAL | 11 refills | Status: DC

## 2020-07-21 NOTE — Patient Instructions (Signed)
Overactive Bladder, Adult  Overactive bladder is a condition in which a person has a sudden and frequent need to urinate. A person might also leak urine if he or she cannot get to the bathroom fast enough (urinary incontinence). Sometimes, symptoms can interfere with work or social activities. What are the causes? Overactive bladder is associated with poor nerve signals between your bladder and your brain. Your bladder may get the signal to empty before it is full. You may also have very sensitive muscles that make your bladder squeeze too soon. This condition may also be caused by other factors, such as:  Medical conditions: ? Urinary tract infection. ? Infection of nearby tissues. ? Prostate enlargement. ? Bladder stones, inflammation, or tumors. ? Diabetes. ? Muscle or nerve weakness, especially from these conditions:  A spinal cord injury.  Stroke.  Multiple sclerosis.  Parkinson's disease.  Other causes: ? Surgery on the uterus or urethra. ? Drinking too much caffeine or alcohol. ? Certain medicines, especially those that eliminate extra fluid in the body (diuretics). ? Constipation. What increases the risk? You may be at greater risk for overactive bladder if you:  Are an older adult.  Smoke.  Are going through menopause.  Have prostate problems.  Have a neurological disease, such as stroke, dementia, Parkinson's disease, or multiple sclerosis (MS).  Eat or drink alcohol, spicy food, caffeine, and other things that irritate the bladder.  Are overweight or obese. What are the signs or symptoms? Symptoms of this condition include a sudden, strong urge to urinate. Other symptoms include:  Leaking urine.  Urinating 8 or more times a day.  Waking up to urinate 2 or more times overnight. How is this diagnosed? This condition may be diagnosed based on:  Your symptoms and medical history.  A physical exam.  Blood or urine tests to check for possible causes,  such as infection. You may also need to see a health care provider who specializes in urinary tract problems. This is called a urologist. How is this treated? Treatment for overactive bladder depends on the cause of your condition and whether it is mild or severe. Treatment may include:  Bladder training, such as: ? Learning to control the urge to urinate by following a schedule to urinate at regular intervals. ? Doing Kegel exercises to strengthen the pelvic floor muscles that support your bladder.  Special devices, such as: ? Biofeedback. This uses sensors to help you become aware of your body's signals. ? Electrical stimulation. This uses electrodes placed inside the body (implanted) or outside the body. These electrodes send gentle pulses of electricity to strengthen the nerves or muscles that control the bladder. ? Women may use a plastic device, called a pessary, that fits into the vagina and supports the bladder.  Medicines, such as: ? Antibiotics to treat bladder infection. ? Antispasmodics to stop the bladder from releasing urine at the wrong time. ? Tricyclic antidepressants to relax bladder muscles. ? Injections of botulinum toxin type A directly into the bladder tissue to relax bladder muscles.  Surgery, such as: ? A device may be implanted to help manage the nerve signals that control urination. ? An electrode may be implanted to stimulate electrical signals in the bladder. ? A procedure may be done to change the shape of the bladder. This is done only in very severe cases. Follow these instructions at home: Eating and drinking  Make diet or lifestyle changes recommended by your health care provider. These may include: ? Drinking fluids   throughout the day and not only with meals. ? Cutting down on caffeine or alcohol. ? Eating a healthy and balanced diet to prevent constipation. This may include:  Choosing foods that are high in fiber, such as beans, whole grains, and  fresh fruits and vegetables.  Limiting foods that are high in fat and processed sugars, such as fried and sweet foods.   Lifestyle  Lose weight if needed.  Do not use any products that contain nicotine or tobacco. These include cigarettes, chewing tobacco, and vaping devices, such as e-cigarettes. If you need help quitting, ask your health care provider.   General instructions  Take over-the-counter and prescription medicines only as told by your health care provider.  If you were prescribed an antibiotic medicine, take it as told by your health care provider. Do not stop taking the antibiotic even if you start to feel better.  Use any implants or pessary as told by your health care provider.  If needed, wear pads to absorb urine leakage.  Keep a log to track how much and when you drink, and when you need to urinate. This will help your health care provider monitor your condition.  Keep all follow-up visits. This is important. Contact a health care provider if:  You have a fever or chills.  Your symptoms do not get better with treatment.  Your pain and discomfort get worse.  You have more frequent urges to urinate. Get help right away if:  You are not able to control your bladder. Summary  Overactive bladder refers to a condition in which a person has a sudden and frequent need to urinate.  Several conditions may lead to an overactive bladder.  Treatment for overactive bladder depends on the cause and severity of your condition.  Making lifestyle changes, doing Kegel exercises, keeping a log, and taking medicines can help with this condition. This information is not intended to replace advice given to you by your health care provider. Make sure you discuss any questions you have with your health care provider. Document Revised: 03/02/2020 Document Reviewed: 03/02/2020 Elsevier Patient Education  2021 Elsevier Inc.  

## 2020-07-22 ENCOUNTER — Telehealth: Payer: Self-pay | Admitting: Family Medicine

## 2020-07-22 MED ORDER — SOLIFENACIN SUCCINATE 5 MG PO TABS: 5.0000 mg | ORAL_TABLET | Freq: Every day | ORAL | 2 refills | Status: DC

## 2020-07-22 NOTE — Telephone Encounter (Signed)
Pharmacy called and states that pt has a high deductible plan can the Jaime Weber is 500$. They were wanting to know if we could send her in something cheaper?   Per Dr. Alyson Ingles ok to do Vesicare 5mg  1 po qd. Pharmacy given verbal order.

## 2020-07-23 NOTE — Telephone Encounter (Signed)
Pt has been seen in office

## 2020-07-23 NOTE — Telephone Encounter (Signed)
Pt has been seen 

## 2020-08-05 ENCOUNTER — Telehealth: Payer: Self-pay

## 2020-08-05 ENCOUNTER — Other Ambulatory Visit: Payer: Self-pay

## 2020-08-05 MED ORDER — SOLIFENACIN SUCCINATE 10 MG PO TABS: 10.0000 mg | ORAL_TABLET | Freq: Every day | ORAL | 11 refills | Status: DC

## 2020-08-05 NOTE — Telephone Encounter (Signed)
Pt called saying the Vesicare Dr.McKenzie has prescribed for her was not working as well as the [employer] she had tried the last time. I spoke with dr. Alyson Ingles and he said to up the Vesicare to 10 mg daily. Prescription was sent.Called pt and left message on her voicemail.

## 2020-08-19 ENCOUNTER — Ambulatory Visit
Admission: RE | Admit: 2020-08-19 | Discharge: 2020-08-19 | Disposition: A | Discharge status: Home / Self Care | Payer: Medicare Other | Source: Ambulatory Visit | Attending: Nurse Practitioner | Admitting: Nurse Practitioner

## 2020-08-19 ENCOUNTER — Other Ambulatory Visit: Payer: Self-pay

## 2020-08-19 DIAGNOSIS — Z1231 Encounter for screening mammogram for malignant neoplasm of breast: Secondary | ICD-10-CM

## 2020-10-07 ENCOUNTER — Telehealth: Payer: Self-pay

## 2020-10-07 NOTE — Telephone Encounter (Signed)
Pt calling regarding a UTI's she keeping having.  She went to her primary doctor and confirmed a UTI.  He gave her a different medication than what Dr. Alyson Ingles prescribes.  Please call pt back to discuss at:  [phone]   Thanks, Helene Kelp

## 2020-10-07 NOTE — Telephone Encounter (Signed)
Returned patients call. She will call office when she gets home to inform Dr. Alyson Ingles of new medication.

## 2020-10-19 ENCOUNTER — Encounter: Payer: Self-pay | Admitting: Urology

## 2020-10-19 ENCOUNTER — Ambulatory Visit (INDEPENDENT_AMBULATORY_CARE_PROVIDER_SITE_OTHER): Payer: Medicare Other | Admitting: Urology

## 2020-10-19 ENCOUNTER — Other Ambulatory Visit: Payer: Self-pay

## 2020-10-19 VITALS — BP 112/67 | HR 75 | Temp 98.6°F | Ht 67.0 in | Wt 159.0 lb

## 2020-10-19 DIAGNOSIS — R32 Unspecified urinary incontinence: Secondary | ICD-10-CM | POA: Diagnosis not present

## 2020-10-19 DIAGNOSIS — N390 Urinary tract infection, site not specified: Secondary | ICD-10-CM | POA: Diagnosis not present

## 2020-10-19 LAB — BLADDER SCAN AMB NON-IMAGING: Scan Result: 112

## 2020-10-19 NOTE — Progress Notes (Signed)
post void residual=112  Urological Symptom Review  Patient is experiencing the following symptoms: Frequent urination Get up at night to urinate   Review of Systems  Gastrointestinal (upper)  : Negative for upper GI symptoms  Gastrointestinal (lower) : Negative for lower GI symptoms  Constitutional : Negative for symptoms  Skin: Negative for skin symptoms  Eyes: Negative for eye symptoms  Ear/Nose/Throat : Negative for Ear/Nose/Throat symptoms  Hematologic/Lymphatic: Negative for Hematologic/Lymphatic symptoms  Cardiovascular : Leg swelling  Respiratory : Negative for respiratory symptoms  Endocrine: Negative for endocrine symptoms  Musculoskeletal: Back pain  Neurological: Negative for neurological symptoms  Psychologic: Negative for psychiatric symptoms

## 2020-10-19 NOTE — Progress Notes (Signed)
10/19/2020 3:00 PM   Jaime Weber 12-03-1940 [id number]  Referring provider: William Dalton, [place] [address]  Followup recurrent UTI and OAB  HPI: Ms Streater is a 80yo here for followup for OAB and recurrent UTI. No UTIs since last visit. She is on macrobid 50mg  qhs. She was placed on vesicare last visit and she noted worsening urgency, frequency and incontinence. Toviaz 4mg  worked well for her. She has failed multiple anticholinergic and beta 3s.    PMH: Past Medical History:  Diagnosis Date  . Atypical mole 09/10/2013   left anterior knee mild  . Borderline glaucoma   . Constipation   . Lesion of bladder   . PONV (postoperative nausea and vomiting)   . Prolapse of female pelvic organs   . Rectocele   . Wears glasses     Surgical History: Past Surgical History:  Procedure Laterality Date  . BREAST BIOPSY    . cataract bilateral   approx may 2019 then 2019,  . COLONOSCOPY  2014   [place], negative per patient  . CYSTOSCOPY N/A 03/15/2019   Procedure: CYSTOSCOPY;  Surgeon: Bjorn Loser, MD;  Location: [facility];  Service: Urology;  Laterality: N/A;  . CYSTOSCOPY W/ RETROGRADES Bilateral 07/13/2015   Procedure: CYSTOSCOPY WITH BILATERAL RETROGRADE PYELOGRAM;  Surgeon: Cleon Gustin, MD;  Location: [facility];  Service: Urology;  Laterality: Bilateral;  . CYSTOSCOPY WITH BIOPSY N/A 07/13/2015   Procedure: CYSTOSCOPY WITH BIOPSY WITH SELECTIVE CYTOLOGIES;  Surgeon: Cleon Gustin, MD;  Location: [facility];  Service: Urology;  Laterality: N/A;  . ORIF RADIAL FRACTURE  07/05/2011   Procedure: OPEN REDUCTION INTERNAL FIXATION (ORIF) RADIAL FRACTURE;  Surgeon: Cammie Sickle., MD;  Location: [facility];  Service: Orthopedics;  Laterality: Left;  . PUBOVAGINAL SLING N/A 03/15/2019   Procedure: Gaynelle Arabian;  Surgeon: Bjorn Loser, MD;  Location: [facility];  Service: Urology;   Laterality: N/A;  . ROBOTIC ASSISTED LAPAROSCOPIC SACROCOLPOPEXY N/A 03/15/2019   Procedure: ROBOTIC ASSISTED LAPAROSCOPIC SACROCOLPOPEXY;  Surgeon: Ardis Hughs, MD;  Location: [facility];  Service: Urology;  Laterality: N/A;  . TONSILLECTOMY  as child  . VAGINAL HYSTERECTOMY  1980's    Home Medications:  Allergies as of 10/19/2020      Reactions   Cephalosporins Rash   Penicillins Rash   Did it involve swelling of the face/tongue/throat, SOB, or low BP? No Did it involve sudden or severe rash/hives, skin peeling, or any reaction on the inside of your mouth or nose? no Did you need to seek medical attention at a hospital or doctor's office? No When did it last happen? Many years ago If all above answers are "NO", may proceed with cephalosporin use.   Sulfa Antibiotics Rash   Adhesive [tape] Rash   Benadryl [diphenhydramine Hcl] Rash   Hydrocodone Rash   Prednisone Itching, Rash      Medication List       Accurate as of October 19, 2020  3:00 PM. If you have any questions, ask your nurse or doctor.        clobetasol 0.05 % external solution Commonly known as: TEMOVATE Apply 1 application topically 2 (two) times daily.   escitalopram 10 MG tablet Commonly known as: LEXAPRO Take 10 mg by mouth daily.   MAGNESIUM PO Take by mouth daily. Magnesium Optimizer   MAXI-CALM PO Take by mouth daily. Calm supplement for constipation   MULTI-ENZYME PO Take  by mouth daily. Whole enzyme supplement   nitrofurantoin (macrocrystal-monohydrate) 100 MG capsule Commonly known as: MACROBID Take 1 capsule (100 mg total) by mouth every 12 (twelve) hours.   nitrofurantoin 50 MG capsule Commonly known as: MACRODANTIN Take 1 capsule (50 mg total) by mouth at bedtime.   ONE-A-DAY BONE STRENGTH PO Take by mouth daily.   solifenacin 10 MG tablet Commonly known as: VESICARE Take 1 tablet (10 mg total) by mouth daily.   SUPER OMEGA 3 PO Take by mouth daily.   traMADol 50 MG  tablet Commonly known as: Ultram Take 1-2 tablets (50-100 mg total) by mouth every 6 (six) hours as needed for moderate pain.   ULTRAFLORA IMMUNE HEALTH PO Take by mouth daily. Ultra-Flora Balance supplement   vitamin C 1000 MG tablet Take 1,000 mg by mouth daily.   VITAMIN D-3 PO Take by mouth daily.       Allergies:  Allergies  Allergen Reactions  . Cephalosporins Rash  . Penicillins Rash    Did it involve swelling of the face/tongue/throat, SOB, or low BP? No Did it involve sudden or severe rash/hives, skin peeling, or any reaction on the inside of your mouth or nose? no Did you need to seek medical attention at a hospital or doctor's office? No When did it last happen? Many years ago If all above answers are "NO", may proceed with cephalosporin use.   . Sulfa Antibiotics Rash  . Adhesive [Tape] Rash  . Benadryl [Diphenhydramine Hcl] Rash  . Hydrocodone Rash  . Prednisone Itching and Rash    Family History: Family History  Problem Relation Age of Onset  . Cancer Father        ORAL   . Heart attack Son        deceased  . Breast cancer Neg Hx     Social History:  reports that she has never smoked. She has never used smokeless tobacco. She reports previous alcohol use. She reports that she does not use drugs.  ROS: All other review of systems were reviewed and are negative except what is noted above in HPI  Physical Exam: BP 112/67   Pulse 75   Temp 98.6 F (37 C)   Ht 5\' 7"  (1.702 m)   Wt 159 lb (72.1 kg)   BMI 24.90 kg/m   Constitutional:  Alert and oriented, No acute distress. HEENT: [employer] AT, moist mucus membranes.  Trachea midline, no masses. Cardiovascular: No clubbing, cyanosis, or edema. Respiratory: Normal respiratory effort, no increased work of breathing. GI: Abdomen is soft, nontender, nondistended, no abdominal masses GU: No CVA tenderness.  Lymph: No cervical or inguinal lymphadenopathy. Skin: No rashes, bruises or suspicious  lesions. Neurologic: Grossly intact, no focal deficits, moving all 4 extremities. Psychiatric: Normal mood and affect.  Laboratory Data: Lab Results  Component Value Date   WBC 9.1 03/12/2019   HGB 11.4 (L) 03/16/2019   HCT 35.8 (L) 03/16/2019   MCV 96.2 03/12/2019   PLT 247 03/12/2019    Lab Results  Component Value Date   CREATININE 0.68 03/16/2019    No results found for: PSA  No results found for: TESTOSTERONE  No results found for: HGBA1C  Urinalysis    Component Value Date/Time   APPEARANCEUR Hazy (A) 07/17/2020 1434   GLUCOSEU Negative 07/17/2020 1434   BILIRUBINUR Negative 07/17/2020 1434   PROTEINUR Negative 07/17/2020 1434   NITRITE Negative 07/17/2020 1434   LEUKOCYTESUR 1+ (A) 07/17/2020 1434    Lab Results  Component  Value Date   LABMICR See below: 07/17/2020   WBCUA 6-10 (A) 07/17/2020   LABEPIT >10 (A) 07/17/2020   BACTERIA Moderate (A) 07/17/2020    Pertinent Imaging:  No results found for this or any previous visit.  No results found for this or any previous visit.  No results found for this or any previous visit.  No results found for this or any previous visit.  No results found for this or any previous visit.  No results found for this or any previous visit.  No results found for this or any previous visit.  No results found for this or any previous visit.   Assessment & Plan:    1. Recurrent UTI -continue macrobid 50mg  qhs - Urinalysis, Routine w reflex microscopic - BLADDER SCAN AMB NON-IMAGING  2. Urinary incontinence in female -We dsicussed continue medical therapy verus PTNS, intravesical botox and interstim. We will schedule for PTNS    No follow-ups on file.  Nicolette Bang, MD  [facility] [place]

## 2020-10-19 NOTE — Patient Instructions (Signed)
Overactive Bladder, Adult  Overactive bladder is a condition in which a person has a sudden and frequent need to urinate. A person might also leak urine if he or she cannot get to the bathroom fast enough (urinary incontinence). Sometimes, symptoms can interfere with work or social activities. What are the causes? Overactive bladder is associated with poor nerve signals between your bladder and your brain. Your bladder may get the signal to empty before it is full. You may also have very sensitive muscles that make your bladder squeeze too soon. This condition may also be caused by other factors, such as:  Medical conditions: ? Urinary tract infection. ? Infection of nearby tissues. ? Prostate enlargement. ? Bladder stones, inflammation, or tumors. ? Diabetes. ? Muscle or nerve weakness, especially from these conditions:  A spinal cord injury.  Stroke.  Multiple sclerosis.  Parkinson's disease.  Other causes: ? Surgery on the uterus or urethra. ? Drinking too much caffeine or alcohol. ? Certain medicines, especially those that eliminate extra fluid in the body (diuretics). ? Constipation. What increases the risk? You may be at greater risk for overactive bladder if you:  Are an older adult.  Smoke.  Are going through menopause.  Have prostate problems.  Have a neurological disease, such as stroke, dementia, Parkinson's disease, or multiple sclerosis (MS).  Eat or drink alcohol, spicy food, caffeine, and other things that irritate the bladder.  Are overweight or obese. What are the signs or symptoms? Symptoms of this condition include a sudden, strong urge to urinate. Other symptoms include:  Leaking urine.  Urinating 8 or more times a day.  Waking up to urinate 2 or more times overnight. How is this diagnosed? This condition may be diagnosed based on:  Your symptoms and medical history.  A physical exam.  Blood or urine tests to check for possible causes,  such as infection. You may also need to see a health care provider who specializes in urinary tract problems. This is called a urologist. How is this treated? Treatment for overactive bladder depends on the cause of your condition and whether it is mild or severe. Treatment may include:  Bladder training, such as: ? Learning to control the urge to urinate by following a schedule to urinate at regular intervals. ? Doing Kegel exercises to strengthen the pelvic floor muscles that support your bladder.  Special devices, such as: ? Biofeedback. This uses sensors to help you become aware of your body's signals. ? Electrical stimulation. This uses electrodes placed inside the body (implanted) or outside the body. These electrodes send gentle pulses of electricity to strengthen the nerves or muscles that control the bladder. ? Women may use a plastic device, called a pessary, that fits into the vagina and supports the bladder.  Medicines, such as: ? Antibiotics to treat bladder infection. ? Antispasmodics to stop the bladder from releasing urine at the wrong time. ? Tricyclic antidepressants to relax bladder muscles. ? Injections of botulinum toxin type A directly into the bladder tissue to relax bladder muscles.  Surgery, such as: ? A device may be implanted to help manage the nerve signals that control urination. ? An electrode may be implanted to stimulate electrical signals in the bladder. ? A procedure may be done to change the shape of the bladder. This is done only in very severe cases. Follow these instructions at home: Eating and drinking  Make diet or lifestyle changes recommended by your health care provider. These may include: ? Drinking fluids   throughout the day and not only with meals. ? Cutting down on caffeine or alcohol. ? Eating a healthy and balanced diet to prevent constipation. This may include:  Choosing foods that are high in fiber, such as beans, whole grains, and  fresh fruits and vegetables.  Limiting foods that are high in fat and processed sugars, such as fried and sweet foods.   Lifestyle  Lose weight if needed.  Do not use any products that contain nicotine or tobacco. These include cigarettes, chewing tobacco, and vaping devices, such as e-cigarettes. If you need help quitting, ask your health care provider.   General instructions  Take over-the-counter and prescription medicines only as told by your health care provider.  If you were prescribed an antibiotic medicine, take it as told by your health care provider. Do not stop taking the antibiotic even if you start to feel better.  Use any implants or pessary as told by your health care provider.  If needed, wear pads to absorb urine leakage.  Keep a log to track how much and when you drink, and when you need to urinate. This will help your health care provider monitor your condition.  Keep all follow-up visits. This is important. Contact a health care provider if:  You have a fever or chills.  Your symptoms do not get better with treatment.  Your pain and discomfort get worse.  You have more frequent urges to urinate. Get help right away if:  You are not able to control your bladder. Summary  Overactive bladder refers to a condition in which a person has a sudden and frequent need to urinate.  Several conditions may lead to an overactive bladder.  Treatment for overactive bladder depends on the cause and severity of your condition.  Making lifestyle changes, doing Kegel exercises, keeping a log, and taking medicines can help with this condition. This information is not intended to replace advice given to you by your health care provider. Make sure you discuss any questions you have with your health care provider. Document Revised: 03/02/2020 Document Reviewed: 03/02/2020 Elsevier Patient Education  2021 Elsevier Inc.  

## 2020-11-04 ENCOUNTER — Ambulatory Visit (INDEPENDENT_AMBULATORY_CARE_PROVIDER_SITE_OTHER): Payer: Medicare Other

## 2020-11-04 ENCOUNTER — Other Ambulatory Visit: Payer: Self-pay

## 2020-11-04 DIAGNOSIS — N390 Urinary tract infection, site not specified: Secondary | ICD-10-CM | POA: Diagnosis not present

## 2020-11-04 DIAGNOSIS — R32 Unspecified urinary incontinence: Secondary | ICD-10-CM

## 2020-11-04 NOTE — Progress Notes (Signed)
PTNS  Session # 1  Health & Social Factors: none Caffeine: 1 cup daily Alcohol: none Daytime voids #per day: "not sure" Night-time voids #per night:  All night Urgency: yes Incontinence Episodes #per day- unknown Ankle use- right Treatment Setting: 6 Feeling/ Response: positive sensation Comments: none  Performed By: Lurline Hare  Assistant: none  Follow Up: 1 week [place]

## 2020-11-04 NOTE — Patient Instructions (Signed)
Percutaneous Nerve Evaluation for Sacral Nerve Stimulation  Sacral nerve stimulation (SNS) is a treatment that uses an implanted device that sends mild electrical impulses to the sacral nerves. The sacral nerves control several functions in the lower part of the body, including bladder and bowel functions. Sacral nerve stimulation can be used to treat disorders that make it hard to control urination and bowel movements. Before having a sacral nerve stimulator implanted, you may go through a trial known as a percutaneous nerve evaluation. This trial helps determine if SNS will help your condition. For the trial, an outpatient procedure will be done to insert temporary wires into your body. Electric pulses will travel through these wires (electrodes) to an area close to your sacral nerves. The electrodes are connected to the temporary nerve stimulator, which remains outside of your body. The nerve stimulator will send electric pulses during the trial period, which usually lasts 1-2 weeks. Tell a health care provider about:  Any allergies you have.  All medicines you are taking, including vitamins, herbs, eye drops, creams, and over-the-counter medicines.  Any problems you or family members have had with anesthetic medicines.  Any blood disorders you have.  Any surgeries you have had.  Any medical conditions you have.  Whether you are pregnant or may be pregnant. What are the risks? Generally, this is a safe procedure. However, problems may occur, including:  Movement of the electrode away from the place where it was inserted (migration).  Infection.  Bleeding.  Damage to nearby structures or organs, such as nerves near the spine.  Uncomfortable sensations, such as a jolting or shocking feeling.  Allergic reactions to medicines. What happens before the procedure? Medicines Ask your health care provider about:  Changing or stopping your regular medicines. This is especially important  if you are taking diabetes medicines or blood thinners.  Taking medicines such as aspirin and ibuprofen. These medicines can thin your blood. Do not take these medicines unless your health care provider tells you to take them.  Taking over-the-counter medicines, vitamins, herbs, and supplements. General instructions  Follow instructions from your health care provider about eating or drinking restrictions.  Ask your health care provider what steps will be taken to help prevent infection. These steps may include: ? Removing hair at the procedure site. ? Washing skin with a germ-killing soap. ? Taking antibiotic medicine. What happens during the procedure?  You will be given a medicine to numb the area (local anesthetic).  Long needles will be inserted into your lower back.  The needles will be guided to the place where the nerves exit the backbone. Your health care provider may use a type of X-ray (fluoroscopy) to guide the needles to the right spot.  The position of the needles will be tested. If they are in the right spot, your toes or feet may move. If you are awake, you may feel a tingling in your legs.  Electrodes will be inserted through the needles and into your body.  The electrodes will be anchored in place close to your sacral nerves.  The ends of the electrodes outside your body will be connected to a nerve stimulator device.  A bandage (dressing) will be applied to cover the electrodes.  Your health care provider will program the rate at which the nerve stimulator will deliver the electric pulses. The procedure may vary among health care providers and hospitals. What happens after the procedure?  Your blood pressure, heart rate, breathing rate, and blood oxygen   level will be monitored until you leave the hospital or clinic.  You will wear the sacral nerve stimulator for the trial period. You will be taught how to use the device.  You may be given pain medicine as  needed. Summary  Sacral nerve stimulation (SNS) is a treatment that uses an implanted device that sends mild electrical impulses to the sacral nerves.  This procedure will determine if a sacral nerve stimulator will help you. If so, you will need to have a second procedure to have a permanent device implanted.  The sacral nerves control several functions in the lower part of the body, including bladder and bowel functions.  SNS can be used to treat disorders that make it hard to control urination and bowel movements. This information is not intended to replace advice given to you by your health care provider. Make sure you discuss any questions you have with your health care provider. Document Revised: 01/17/2020 Document Reviewed: 01/17/2020 Elsevier Patient Education  2021 [employer]  

## 2020-11-12 ENCOUNTER — Other Ambulatory Visit: Payer: Self-pay

## 2020-11-12 ENCOUNTER — Ambulatory Visit (INDEPENDENT_AMBULATORY_CARE_PROVIDER_SITE_OTHER): Payer: Medicare Other

## 2020-11-12 DIAGNOSIS — R32 Unspecified urinary incontinence: Secondary | ICD-10-CM

## 2020-11-12 NOTE — Progress Notes (Signed)
PTNS  Session # 2  Health & Social Factors: none Caffeine: 1 cup daily Alcohol: none Daytime voids #per day: 9-10 times Night-time voids #per night: all night Urgency: yes Incontinence Episodes #per day: unknown Ankle used: right Treatment Setting: 8 Feeling/ Response: strong tingling sensation  Comments: none  Performed By: Estill Bamberg RN  Assistant: none  Follow Up: weekly PTNS

## 2020-11-12 NOTE — Patient Instructions (Signed)
Percutaneous Nerve Evaluation for Sacral Nerve Stimulation  Sacral nerve stimulation (SNS) is a treatment that uses an implanted device that sends mild electrical impulses to the sacral nerves. The sacral nerves control several functions in the lower part of the body, including bladder and bowel functions. Sacral nerve stimulation can be used to treat disorders that make it hard to control urination and bowel movements. Before having a sacral nerve stimulator implanted, you may go through a trial known as a percutaneous nerve evaluation. This trial helps determine if SNS will help your condition. For the trial, an outpatient procedure will be done to insert temporary wires into your body. Electric pulses will travel through these wires (electrodes) to an area close to your sacral nerves. The electrodes are connected to the temporary nerve stimulator, which remains outside of your body. The nerve stimulator will send electric pulses during the trial period, which usually lasts 1-2 weeks. Tell a health care provider about:  Any allergies you have.  All medicines you are taking, including vitamins, herbs, eye drops, creams, and over-the-counter medicines.  Any problems you or family members have had with anesthetic medicines.  Any blood disorders you have.  Any surgeries you have had.  Any medical conditions you have.  Whether you are pregnant or may be pregnant. What are the risks? Generally, this is a safe procedure. However, problems may occur, including:  Movement of the electrode away from the place where it was inserted (migration).  Infection.  Bleeding.  Damage to nearby structures or organs, such as nerves near the spine.  Uncomfortable sensations, such as a jolting or shocking feeling.  Allergic reactions to medicines. What happens before the procedure? Medicines Ask your health care provider about:  Changing or stopping your regular medicines. This is especially important  if you are taking diabetes medicines or blood thinners.  Taking medicines such as aspirin and ibuprofen. These medicines can thin your blood. Do not take these medicines unless your health care provider tells you to take them.  Taking over-the-counter medicines, vitamins, herbs, and supplements. General instructions  Follow instructions from your health care provider about eating or drinking restrictions.  Ask your health care provider what steps will be taken to help prevent infection. These steps may include: ? Removing hair at the procedure site. ? Washing skin with a germ-killing soap. ? Taking antibiotic medicine. What happens during the procedure?  You will be given a medicine to numb the area (local anesthetic).  Long needles will be inserted into your lower back.  The needles will be guided to the place where the nerves exit the backbone. Your health care provider may use a type of X-ray (fluoroscopy) to guide the needles to the right spot.  The position of the needles will be tested. If they are in the right spot, your toes or feet may move. If you are awake, you may feel a tingling in your legs.  Electrodes will be inserted through the needles and into your body.  The electrodes will be anchored in place close to your sacral nerves.  The ends of the electrodes outside your body will be connected to a nerve stimulator device.  A bandage (dressing) will be applied to cover the electrodes.  Your health care provider will program the rate at which the nerve stimulator will deliver the electric pulses. The procedure may vary among health care providers and hospitals. What happens after the procedure?  Your blood pressure, heart rate, breathing rate, and blood oxygen   level will be monitored until you leave the hospital or clinic.  You will wear the sacral nerve stimulator for the trial period. You will be taught how to use the device.  You may be given pain medicine as  needed. Summary  Sacral nerve stimulation (SNS) is a treatment that uses an implanted device that sends mild electrical impulses to the sacral nerves.  This procedure will determine if a sacral nerve stimulator will help you. If so, you will need to have a second procedure to have a permanent device implanted.  The sacral nerves control several functions in the lower part of the body, including bladder and bowel functions.  SNS can be used to treat disorders that make it hard to control urination and bowel movements. This information is not intended to replace advice given to you by your health care provider. Make sure you discuss any questions you have with your health care provider. Document Revised: 01/17/2020 Document Reviewed: 01/17/2020 Elsevier Patient Education  2021 [employer]  

## 2020-11-18 ENCOUNTER — Ambulatory Visit: Payer: Medicare Other

## 2020-11-18 NOTE — Progress Notes (Signed)
PTNS  Session # 3 of 12  Health & Social Factors: none Caffeine: 1 cup a day Alcohol: none Daytime voids #per day: 10-11 Night-time voids #per night: all night Urgency: yes Incontinence Episodes #per day: 1 x at night Ankle used: right Treatment Setting: 10 Feeling/ Response: positive sensation from heel to toe Comments: none  Performed By: [facility] LPN  Assistant: none  Follow Up: keep next scheduled NV

## 2020-11-19 ENCOUNTER — Other Ambulatory Visit: Payer: Self-pay

## 2020-11-19 ENCOUNTER — Ambulatory Visit (INDEPENDENT_AMBULATORY_CARE_PROVIDER_SITE_OTHER): Payer: Medicare Other

## 2020-11-19 DIAGNOSIS — R32 Unspecified urinary incontinence: Secondary | ICD-10-CM

## 2020-11-19 NOTE — Patient Instructions (Signed)
Percutaneous Nerve Evaluation for Sacral Nerve Stimulation  Sacral nerve stimulation (SNS) is a treatment that uses an implanted device that sends mild electrical impulses to the sacral nerves. The sacral nerves control several functions in the lower part of the body, including bladder and bowel functions. Sacral nerve stimulation can be used to treat disorders that make it hard to control urination and bowel movements. Before having a sacral nerve stimulator implanted, you may go through a trial known as a percutaneous nerve evaluation. This trial helps determine if SNS will help your condition. For the trial, an outpatient procedure will be done to insert temporary wires into your body. Electric pulses will travel through these wires (electrodes) to an area close to your sacral nerves. The electrodes are connected to the temporary nerve stimulator, which remains outside of your body. The nerve stimulator will send electric pulses during the trial period, which usually lasts 1-2 weeks. Tell a health care provider about:  Any allergies you have.  All medicines you are taking, including vitamins, herbs, eye drops, creams, and over-the-counter medicines.  Any problems you or family members have had with anesthetic medicines.  Any blood disorders you have.  Any surgeries you have had.  Any medical conditions you have.  Whether you are pregnant or may be pregnant. What are the risks? Generally, this is a safe procedure. However, problems may occur, including:  Movement of the electrode away from the place where it was inserted (migration).  Infection.  Bleeding.  Damage to nearby structures or organs, such as nerves near the spine.  Uncomfortable sensations, such as a jolting or shocking feeling.  Allergic reactions to medicines. What happens before the procedure? Medicines Ask your health care provider about:  Changing or stopping your regular medicines. This is especially important  if you are taking diabetes medicines or blood thinners.  Taking medicines such as aspirin and ibuprofen. These medicines can thin your blood. Do not take these medicines unless your health care provider tells you to take them.  Taking over-the-counter medicines, vitamins, herbs, and supplements. General instructions  Follow instructions from your health care provider about eating or drinking restrictions.  Ask your health care provider what steps will be taken to help prevent infection. These steps may include: ? Removing hair at the procedure site. ? Washing skin with a germ-killing soap. ? Taking antibiotic medicine. What happens during the procedure?  You will be given a medicine to numb the area (local anesthetic).  Long needles will be inserted into your lower back.  The needles will be guided to the place where the nerves exit the backbone. Your health care provider may use a type of X-ray (fluoroscopy) to guide the needles to the right spot.  The position of the needles will be tested. If they are in the right spot, your toes or feet may move. If you are awake, you may feel a tingling in your legs.  Electrodes will be inserted through the needles and into your body.  The electrodes will be anchored in place close to your sacral nerves.  The ends of the electrodes outside your body will be connected to a nerve stimulator device.  A bandage (dressing) will be applied to cover the electrodes.  Your health care provider will program the rate at which the nerve stimulator will deliver the electric pulses. The procedure may vary among health care providers and hospitals. What happens after the procedure?  Your blood pressure, heart rate, breathing rate, and blood oxygen   level will be monitored until you leave the hospital or clinic.  You will wear the sacral nerve stimulator for the trial period. You will be taught how to use the device.  You may be given pain medicine as  needed. Summary  Sacral nerve stimulation (SNS) is a treatment that uses an implanted device that sends mild electrical impulses to the sacral nerves.  This procedure will determine if a sacral nerve stimulator will help you. If so, you will need to have a second procedure to have a permanent device implanted.  The sacral nerves control several functions in the lower part of the body, including bladder and bowel functions.  SNS can be used to treat disorders that make it hard to control urination and bowel movements. This information is not intended to replace advice given to you by your health care provider. Make sure you discuss any questions you have with your health care provider. Document Revised: 01/17/2020 Document Reviewed: 01/17/2020 Elsevier Patient Education  2021 [employer]  

## 2020-11-26 ENCOUNTER — Ambulatory Visit (INDEPENDENT_AMBULATORY_CARE_PROVIDER_SITE_OTHER): Payer: Medicare Other

## 2020-11-26 ENCOUNTER — Other Ambulatory Visit: Payer: Self-pay

## 2020-11-26 DIAGNOSIS — R32 Unspecified urinary incontinence: Secondary | ICD-10-CM

## 2020-11-26 NOTE — Progress Notes (Signed)
PTNS  Session # 4  Health & Social Factors: none Caffeine: 1 cup coffee Alcohol: none Daytime voids #per day: 12 Night-time voids #per night: all night Urgency: yes Incontinence Episodes #per day: unknown Ankle used: right Treatment Setting: 4 Feeling/ Response: strong sensation Comments:   Performed By: Lurline Hare  Assistant:   Follow Up: 1 week NV

## 2020-12-02 ENCOUNTER — Ambulatory Visit (INDEPENDENT_AMBULATORY_CARE_PROVIDER_SITE_OTHER): Payer: Medicare Other

## 2020-12-02 ENCOUNTER — Other Ambulatory Visit: Payer: Self-pay

## 2020-12-02 DIAGNOSIS — R32 Unspecified urinary incontinence: Secondary | ICD-10-CM

## 2020-12-02 NOTE — Progress Notes (Signed)
PTNS  Session # 5  Health & Social Factors: none Caffeine: 1 cup daily Alcohol: none Daytime voids #per day: 10-12 Night-time voids #per night: all night Urgency: yes Incontinence Episodes #per day: 2-3 day Ankle used: right Treatment Setting: 5 Feeling/ Response: heel tingling Comments:   Performed By: Estill Bamberg RN  Assistant: none  Follow Up: 1 week NV

## 2020-12-09 ENCOUNTER — Other Ambulatory Visit: Payer: Self-pay

## 2020-12-09 ENCOUNTER — Ambulatory Visit (INDEPENDENT_AMBULATORY_CARE_PROVIDER_SITE_OTHER): Payer: Medicare Other

## 2020-12-09 DIAGNOSIS — R32 Unspecified urinary incontinence: Secondary | ICD-10-CM | POA: Diagnosis not present

## 2020-12-09 NOTE — Progress Notes (Signed)
PTNS  Session # 6  Health & Social Factors: none Caffeine: 1 cup Alcohol: none Daytime voids #per day: 10-12 Night-time voids #per night: incontinent Urgency: yes Incontinence Episodes #per day: 4-6 times week Ankle used: right Treatment Setting: 6 Feeling/ Response: heel throbbing Comments: none  Performed By: Estill Bamberg RN  Assistant: none  Follow Up: weekly PTNS

## 2020-12-14 ENCOUNTER — Ambulatory Visit: Payer: Medicare Other

## 2020-12-16 ENCOUNTER — Ambulatory Visit (INDEPENDENT_AMBULATORY_CARE_PROVIDER_SITE_OTHER): Payer: Medicare Other

## 2020-12-16 ENCOUNTER — Other Ambulatory Visit: Payer: Self-pay

## 2020-12-16 DIAGNOSIS — R32 Unspecified urinary incontinence: Secondary | ICD-10-CM | POA: Diagnosis not present

## 2020-12-16 NOTE — Progress Notes (Signed)
PTNS  Session # 7  Health & Social Factors: none Caffeine: 1 cup day Alcohol: none Daytime voids #per day: 9-10 Night-time voids #per night: all night Urgency: yes Incontinence Episodes #per day: 6-7 /week Ankle used: right Treatment Setting: 5 Feeling/ Response: pulsing Comments: none  Performed By: Estill Bamberg RN  Assistant: none  Follow Up: 1 week NV

## 2020-12-21 ENCOUNTER — Ambulatory Visit: Payer: Medicare Other

## 2020-12-23 ENCOUNTER — Ambulatory Visit: Payer: Medicare Other

## 2020-12-24 ENCOUNTER — Other Ambulatory Visit: Payer: Self-pay

## 2020-12-24 ENCOUNTER — Ambulatory Visit (INDEPENDENT_AMBULATORY_CARE_PROVIDER_SITE_OTHER): Payer: Medicare Other

## 2020-12-24 DIAGNOSIS — R32 Unspecified urinary incontinence: Secondary | ICD-10-CM | POA: Diagnosis not present

## 2020-12-24 NOTE — Progress Notes (Signed)
PTNS  Session # 8 of 12  Health & Social Factors: none Caffeine: 1 cup daily Alcohol: none Daytime voids #per day: 10-12 Night-time voids #per night: all night Urgency: yes Incontinence Episodes #per day: 1-2 daily Ankle used: right Treatment Setting: 9 Feeling/ Response: positive sensation from heel to toe Comments: I have not noticed any changes   Performed By: [facility] LPN  Assistant: non  Follow Up: Keep next scheduled NV

## 2020-12-29 ENCOUNTER — Ambulatory Visit: Payer: Medicare Other

## 2020-12-30 ENCOUNTER — Ambulatory Visit: Payer: Medicare Other

## 2021-01-04 ENCOUNTER — Ambulatory Visit: Payer: Medicare Other

## 2021-01-07 ENCOUNTER — Ambulatory Visit (INDEPENDENT_AMBULATORY_CARE_PROVIDER_SITE_OTHER): Payer: Medicare Other

## 2021-01-07 ENCOUNTER — Other Ambulatory Visit: Payer: Self-pay

## 2021-01-07 DIAGNOSIS — R32 Unspecified urinary incontinence: Secondary | ICD-10-CM | POA: Diagnosis not present

## 2021-01-07 LAB — URINALYSIS, ROUTINE W REFLEX MICROSCOPIC
Bilirubin, UA: NEGATIVE
Glucose, UA: NEGATIVE
Ketones, UA: NEGATIVE
Nitrite, UA: POSITIVE — AB
Protein,UA: NEGATIVE
Specific Gravity, UA: 1.015 (ref 1.005–1.030)
Urobilinogen, Ur: 0.2 mg/dL (ref 0.2–1.0)
pH, UA: 7 (ref 5.0–7.5)

## 2021-01-07 LAB — MICROSCOPIC EXAMINATION
Epithelial Cells (non renal): >10 /hpf — AB (ref 0–10)
Renal Epithel, UA: NONE SEEN /hpf
WBC, UA: >30 /hpf — AB (ref 0–5)

## 2021-01-07 MED ORDER — NITROFURANTOIN MONOHYD MACRO 100 MG PO CAPS: 100.0000 mg | ORAL_CAPSULE | Freq: Two times a day (BID) | ORAL | 0 refills | Status: DC

## 2021-01-07 NOTE — Addendum Note (Signed)
Addended byIris Pert on: 01/07/2021 11:20 AM   Modules accepted: Orders

## 2021-01-07 NOTE — Progress Notes (Signed)
PTNS  Session # 9 of 12  Health & Social Factors: non Caffeine: 1 cup Alcohol: none Daytime voids #per day: 14-15 Night-time voids #per night: all night Urgency: yes Incontinence Episodes #per day: 3-4 Ankle used: left Treatment Setting: 4 Feeling/ Response: positive sensation from heel to toe Comments: n/a  Performed By: [facility] LPN  Assistant: none  Follow Up: Keep next scheduled NV

## 2021-01-10 LAB — URINE CULTURE

## 2021-01-11 ENCOUNTER — Ambulatory Visit: Payer: Medicare Other

## 2021-01-13 ENCOUNTER — Other Ambulatory Visit: Payer: Self-pay

## 2021-01-13 ENCOUNTER — Ambulatory Visit (INDEPENDENT_AMBULATORY_CARE_PROVIDER_SITE_OTHER): Payer: Medicare Other | Admitting: Urology

## 2021-01-13 DIAGNOSIS — N390 Urinary tract infection, site not specified: Secondary | ICD-10-CM | POA: Diagnosis not present

## 2021-01-13 DIAGNOSIS — R32 Unspecified urinary incontinence: Secondary | ICD-10-CM

## 2021-01-13 NOTE — Progress Notes (Signed)
PTNS  Session # 10  Health & Social Factors: none Caffeine: 1 cup Alcohol: none Daytime voids #per day: 12 Night-time voids #per night: all night Urgency: yes Incontinence Episodes #per day: 1-2 daily Ankle used: right Treatment Setting: 9 Feeling/ Response: pulsing Comments: none  Performed By: Estill Bamberg RN  Assistant:   Follow Up: 1 week PTNS   Patient c/o knot to vaginal area- MD in to see patient.

## 2021-01-14 MED ORDER — DOXYCYCLINE HYCLATE 100 MG PO CAPS: 100.0000 mg | ORAL_CAPSULE | Freq: Two times a day (BID) | ORAL | 0 refills | Status: DC

## 2021-01-14 NOTE — Addendum Note (Signed)
Addended by: Cleon Gustin on: 01/14/2021 01:21 PM   Modules accepted: Orders

## 2021-01-18 ENCOUNTER — Ambulatory Visit: Payer: Medicare Other

## 2021-01-21 ENCOUNTER — Other Ambulatory Visit: Payer: Self-pay

## 2021-01-21 ENCOUNTER — Ambulatory Visit (INDEPENDENT_AMBULATORY_CARE_PROVIDER_SITE_OTHER): Payer: Medicare Other

## 2021-01-21 DIAGNOSIS — R32 Unspecified urinary incontinence: Secondary | ICD-10-CM

## 2021-01-21 NOTE — Progress Notes (Signed)
PTNS  Session # 11 of 12  Health & Social Factors: none Caffeine: 1 cup of coffee Alcohol: none Daytime voids #per day: 12-14 Night-time voids #per night: all night Urgency: yes Incontinence Episodes #per day: 5-6 times daily Ankle used: right Treatment Setting: 7 Feeling/ Response: positive sensation from heel to toe Comments: n/a  Performed By: Anab Vivar LPN  Assistant: none  Follow Up: keep next scheduled NV

## 2021-01-28 ENCOUNTER — Ambulatory Visit (INDEPENDENT_AMBULATORY_CARE_PROVIDER_SITE_OTHER): Payer: Medicare Other

## 2021-01-28 ENCOUNTER — Ambulatory Visit: Payer: Medicare Other

## 2021-01-28 ENCOUNTER — Other Ambulatory Visit: Payer: Self-pay

## 2021-01-28 DIAGNOSIS — R32 Unspecified urinary incontinence: Secondary | ICD-10-CM | POA: Diagnosis not present

## 2021-01-28 LAB — MICROSCOPIC EXAMINATION: Renal Epithel, UA: NONE SEEN /hpf

## 2021-01-28 LAB — URINALYSIS, ROUTINE W REFLEX MICROSCOPIC
Bilirubin, UA: NEGATIVE
Glucose, UA: NEGATIVE
Ketones, UA: NEGATIVE
Nitrite, UA: NEGATIVE
Protein,UA: NEGATIVE
Specific Gravity, UA: 1.015 (ref 1.005–1.030)
Urobilinogen, Ur: 0.2 mg/dL (ref 0.2–1.0)
pH, UA: 6.5 (ref 5.0–7.5)

## 2021-01-28 NOTE — Progress Notes (Signed)
PTNS  Session # 12 of 12  Health & Social Factors: none Caffeine: 1 daily Alcohol: none Daytime voids #per day: 7-8 times Night-time voids #per night: all night Urgency: yes Incontinence Episodes #per day: 2 Ankle used: right Treatment Setting: 8 Feeling/ Response: positive sensation from heel to toe Comments: It has finally started to work  Performed By: [facility] LPN  Assistant: none  Follow Up: keep next scheduled Virtual OV   Urine sent for ua/cx

## 2021-01-30 LAB — URINE CULTURE: Organism ID, Bacteria: NO GROWTH

## 2021-02-23 ENCOUNTER — Other Ambulatory Visit: Payer: Self-pay

## 2021-02-23 ENCOUNTER — Encounter: Payer: Self-pay | Admitting: Urology

## 2021-02-23 ENCOUNTER — Telehealth (INDEPENDENT_AMBULATORY_CARE_PROVIDER_SITE_OTHER): Payer: Medicare Other | Admitting: Urology

## 2021-02-23 DIAGNOSIS — R32 Unspecified urinary incontinence: Secondary | ICD-10-CM

## 2021-02-23 DIAGNOSIS — N390 Urinary tract infection, site not specified: Secondary | ICD-10-CM

## 2021-02-23 MED ORDER — NITROFURANTOIN MACROCRYSTAL 50 MG PO CAPS: 50.0000 mg | ORAL_CAPSULE | Freq: Every day | ORAL | 11 refills | Status: DC

## 2021-02-23 NOTE — Progress Notes (Signed)
02/23/2021 1:37 PM   BYANKA MULE 10/14/1940 [id number]  Referring provider: William Dalton, [place],  [place] [phone]  Patient location: home Physician location: office I connected with  MARIANNY MCFEELY on 02/23/21 by a video enabled telemedicine application and verified that I am speaking with the correct person using two identifiers.   I discussed the limitations of evaluation and management by telemedicine. The patient expressed understanding and agreed to proceed.    Followup Urge incontinence   HPI: [name] is a 80yo here for followup for urge incontinence. She completed 12 PTNS sessions and she noticed improvement starting after session 9. She notes resolution of her daytime incontinence but she continues to wear a pad at night. NO infections since last visit. No other complaints today.    PMH: Past Medical History:  Diagnosis Date   Atypical mole 09/10/2013   left anterior knee mild   Borderline glaucoma    Constipation    Lesion of bladder    PONV (postoperative nausea and vomiting)    Prolapse of female pelvic organs    Rectocele    Wears glasses     Surgical History: Past Surgical History:  Procedure Laterality Date   BREAST BIOPSY     cataract bilateral   approx may 2019 then 2019,   COLONOSCOPY  2014   [place], negative per patient   CYSTOSCOPY N/A 03/15/2019   Procedure: CYSTOSCOPY;  Surgeon: Bjorn Loser, MD;  Location: [facility];  Service: Urology;  Laterality: N/A;   CYSTOSCOPY W/ RETROGRADES Bilateral 07/13/2015   Procedure: CYSTOSCOPY WITH BILATERAL RETROGRADE PYELOGRAM;  Surgeon: Cleon Gustin, MD;  Location: [facility];  Service: Urology;  Laterality: Bilateral;   CYSTOSCOPY WITH BIOPSY N/A 07/13/2015   Procedure: CYSTOSCOPY WITH BIOPSY WITH SELECTIVE CYTOLOGIES;  Surgeon: Cleon Gustin, MD;  Location: [facility];  Service: Urology;  Laterality: N/A;   ORIF RADIAL FRACTURE   07/05/2011   Procedure: OPEN REDUCTION INTERNAL FIXATION (ORIF) RADIAL FRACTURE;  Surgeon: Cammie Sickle., MD;  Location: [facility];  Service: Orthopedics;  Laterality: Left;   PUBOVAGINAL SLING N/A 03/15/2019   Procedure: Gaynelle Arabian;  Surgeon: Bjorn Loser, MD;  Location: [facility];  Service: Urology;  Laterality: N/A;   ROBOTIC ASSISTED LAPAROSCOPIC SACROCOLPOPEXY N/A 03/15/2019   Procedure: ROBOTIC ASSISTED LAPAROSCOPIC SACROCOLPOPEXY;  Surgeon: Ardis Hughs, MD;  Location: [facility];  Service: Urology;  Laterality: N/A;   TONSILLECTOMY  as child   VAGINAL HYSTERECTOMY  1980's    Home Medications:  Allergies as of 02/23/2021       Reactions   Cephalosporins Rash   Penicillins Rash   Did it involve swelling of the face/tongue/throat, SOB, or low BP? No Did it involve sudden or severe rash/hives, skin peeling, or any reaction on the inside of your mouth or nose? no Did you need to seek medical attention at a hospital or doctor's office? No When did it last happen? Many years ago       If all above answers are "NO", may proceed with cephalosporin use.   Sulfa Antibiotics Rash   Adhesive [tape] Rash   Benadryl [diphenhydramine Hcl] Rash   Hydrocodone Rash   Prednisone Itching, Rash        Medication List        Accurate as of February 23, 2021  1:37 PM. If you have any questions, ask your nurse or doctor.  clobetasol 0.05 % external solution Commonly known as: TEMOVATE Apply 1 application topically 2 (two) times daily.   doxycycline 100 MG capsule Commonly known as: VIBRAMYCIN Take 1 capsule (100 mg total) by mouth every 12 (twelve) hours.   escitalopram 10 MG tablet Commonly known as: LEXAPRO Take 10 mg by mouth daily.   MAGNESIUM PO Take by mouth daily. Magnesium Optimizer   MAXI-CALM PO Take by mouth daily. Calm supplement for constipation   MULTI-ENZYME PO Take by mouth daily. Whole enzyme supplement   nitrofurantoin  (macrocrystal-monohydrate) 100 MG capsule Commonly known as: MACROBID Take 1 capsule (100 mg total) by mouth every 12 (twelve) hours.   nitrofurantoin (macrocrystal-monohydrate) 100 MG capsule Commonly known as: MACROBID Take 1 capsule (100 mg total) by mouth 2 (two) times daily.   nitrofurantoin 50 MG capsule Commonly known as: MACRODANTIN Take 1 capsule (50 mg total) by mouth at bedtime.   ONE-A-DAY BONE STRENGTH PO Take by mouth daily.   solifenacin 10 MG tablet Commonly known as: VESICARE Take 1 tablet (10 mg total) by mouth daily.   SUPER OMEGA 3 PO Take by mouth daily.   traMADol 50 MG tablet Commonly known as: Ultram Take 1-2 tablets (50-100 mg total) by mouth every 6 (six) hours as needed for moderate pain.   ULTRAFLORA IMMUNE HEALTH PO Take by mouth daily. Ultra-Flora Balance supplement   vitamin C 1000 MG tablet Take 1,000 mg by mouth daily.   VITAMIN D-3 PO Take by mouth daily.        Allergies:  Allergies  Allergen Reactions   Cephalosporins Rash   Penicillins Rash    Did it involve swelling of the face/tongue/throat, SOB, or low BP? No Did it involve sudden or severe rash/hives, skin peeling, or any reaction on the inside of your mouth or nose? no Did you need to seek medical attention at a hospital or doctor's office? No When did it last happen? Many years ago       If all above answers are "NO", may proceed with cephalosporin use.    Sulfa Antibiotics Rash   Adhesive [Tape] Rash   Benadryl [Diphenhydramine Hcl] Rash   Hydrocodone Rash   Prednisone Itching and Rash    Family History: Family History  Problem Relation Age of Onset   Cancer Father        ORAL    Heart attack Son        deceased   Breast cancer Neg Hx     Social History:  reports that she has never smoked. She has never used smokeless tobacco. She reports that she does not currently use alcohol. She reports that she does not use drugs.  ROS: All other review of systems  were reviewed and are negative except what is noted above in HPI   Laboratory Data: Lab Results  Component Value Date   WBC 9.1 03/12/2019   HGB 11.4 (L) 03/16/2019   HCT 35.8 (L) 03/16/2019   MCV 96.2 03/12/2019   PLT 247 03/12/2019    Lab Results  Component Value Date   CREATININE 0.68 03/16/2019    No results found for: PSA  No results found for: TESTOSTERONE  No results found for: HGBA1C  Urinalysis    Component Value Date/Time   APPEARANCEUR Clear 01/28/2021 1033   GLUCOSEU Negative 01/28/2021 1033   BILIRUBINUR Negative 01/28/2021 1033   PROTEINUR Negative 01/28/2021 1033   NITRITE Negative 01/28/2021 1033   LEUKOCYTESUR Trace (A) 01/28/2021 1033    Lab  Results  Component Value Date   LABMICR See below: 01/28/2021   WBCUA 6-10 (A) 01/28/2021   LABEPIT 0-10 01/28/2021   BACTERIA Few 01/28/2021    Pertinent Imaging:  No results found for this or any previous visit.  No results found for this or any previous visit.  No results found for this or any previous visit.  No results found for this or any previous visit.  No results found for this or any previous visit.  No results found for this or any previous visit.  No results found for this or any previous visit.  No results found for this or any previous visit.   Assessment & Plan:    1. Urinary incontinence in female -We will proceed with maintenance PTNS.  2. Recurrent UTI Continue macrobid '50mg'$  qhs   No follow-ups on file.  Nicolette Bang, MD  [facility] [place]

## 2021-02-23 NOTE — Patient Instructions (Signed)

## 2021-02-23 NOTE — Progress Notes (Signed)
01/13/2021 8:48 AM   Jaime Weber July 25, 1940 [id number]  Referring provider: William Dalton, FNP [address] [place],  [place] [id number]  Vaginal mass   HPI: Jaime Weber is a 80yo here for PTNS but has a new complaints of a vaginal mass. Starting 2 weeks ago she noted a small mass in her vagina which has nontender. It has not grown in size. She denies any drainage from mass. She notes her LUTS have improved with PTNS. She denies any UTIs since last visit   PMH: Past Medical History:  Diagnosis Date   Atypical mole 09/10/2013   left anterior knee mild   Borderline glaucoma    Constipation    Lesion of bladder    PONV (postoperative nausea and vomiting)    Prolapse of female pelvic organs    Rectocele    Wears glasses     Surgical History: Past Surgical History:  Procedure Laterality Date   BREAST BIOPSY     cataract bilateral   approx may 2019 then 2019,   COLONOSCOPY  2014   [place], negative per patient   CYSTOSCOPY N/A 03/15/2019   Procedure: CYSTOSCOPY;  Surgeon: Bjorn Loser, MD;  Location: [facility];  Service: Urology;  Laterality: N/A;   CYSTOSCOPY W/ RETROGRADES Bilateral 07/13/2015   Procedure: CYSTOSCOPY WITH BILATERAL RETROGRADE PYELOGRAM;  Surgeon: Cleon Gustin, MD;  Location: [facility];  Service: Urology;  Laterality: Bilateral;   CYSTOSCOPY WITH BIOPSY N/A 07/13/2015   Procedure: CYSTOSCOPY WITH BIOPSY WITH SELECTIVE CYTOLOGIES;  Surgeon: Cleon Gustin, MD;  Location: [facility];  Service: Urology;  Laterality: N/A;   ORIF RADIAL FRACTURE  07/05/2011   Procedure: OPEN REDUCTION INTERNAL FIXATION (ORIF) RADIAL FRACTURE;  Surgeon: Cammie Sickle., MD;  Location: [place];  Service: Orthopedics;  Laterality: Left;   PUBOVAGINAL SLING N/A 03/15/2019   Procedure: Gaynelle Arabian;  Surgeon: Bjorn Loser, MD;  Location: [facility];  Service: Urology;  Laterality: N/A;   ROBOTIC ASSISTED  LAPAROSCOPIC SACROCOLPOPEXY N/A 03/15/2019   Procedure: ROBOTIC ASSISTED LAPAROSCOPIC SACROCOLPOPEXY;  Surgeon: Ardis Hughs, MD;  Location: [facility];  Service: Urology;  Laterality: N/A;   TONSILLECTOMY  as child   VAGINAL HYSTERECTOMY  1980's    Home Medications:  Allergies as of 01/13/2021       Reactions   Cephalosporins Rash   Penicillins Rash   Did it involve swelling of the face/tongue/throat, SOB, or low BP? No Did it involve sudden or severe rash/hives, skin peeling, or any reaction on the inside of your mouth or nose? no Did you need to seek medical attention Weber a hospital or doctor's office? No When did it last happen? Many years ago       If all above answers are "NO", may proceed with cephalosporin use.   Sulfa Antibiotics Rash   Adhesive [tape] Rash   Benadryl [diphenhydramine Hcl] Rash   Hydrocodone Rash   Prednisone Itching, Rash        Medication List        Accurate as of January 13, 2021 11:59 PM. If you have any questions, ask your nurse or doctor.          clobetasol 0.05 % external solution Commonly known as: TEMOVATE Apply 1 application topically 2 (two) times daily.   doxycycline 100 MG capsule Commonly known as: VIBRAMYCIN Take 1 capsule (100 mg total) by mouth every 12 (twelve) hours.   escitalopram 10 MG tablet Commonly  known as: LEXAPRO Take 10 mg by mouth daily.   MAGNESIUM PO Take by mouth daily. Magnesium Optimizer   MAXI-CALM PO Take by mouth daily. Calm supplement for constipation   MULTI-ENZYME PO Take by mouth daily. Whole enzyme supplement   nitrofurantoin (macrocrystal-monohydrate) 100 MG capsule Commonly known as: MACROBID Take 1 capsule (100 mg total) by mouth every 12 (twelve) hours.   nitrofurantoin (macrocrystal-monohydrate) 100 MG capsule Commonly known as: MACROBID Take 1 capsule (100 mg total) by mouth 2 (two) times daily.   nitrofurantoin 50 MG capsule Commonly known as: MACRODANTIN Take 1 capsule (50 mg  total) by mouth Weber bedtime.   ONE-A-DAY BONE STRENGTH PO Take by mouth daily.   solifenacin 10 MG tablet Commonly known as: VESICARE Take 1 tablet (10 mg total) by mouth daily.   SUPER OMEGA 3 PO Take by mouth daily.   traMADol 50 MG tablet Commonly known as: Ultram Take 1-2 tablets (50-100 mg total) by mouth every 6 (six) hours as needed for moderate pain.   ULTRAFLORA IMMUNE HEALTH PO Take by mouth daily. Ultra-Flora Balance supplement   vitamin C 1000 MG tablet Take 1,000 mg by mouth daily.   VITAMIN D-3 PO Take by mouth daily.        Allergies:  Allergies  Allergen Reactions   Cephalosporins Rash   Penicillins Rash    Did it involve swelling of the face/tongue/throat, SOB, or low BP? No Did it involve sudden or severe rash/hives, skin peeling, or any reaction on the inside of your mouth or nose? no Did you need to seek medical attention Weber a hospital or doctor's office? No When did it last happen? Many years ago       If all above answers are "NO", may proceed with cephalosporin use.    Sulfa Antibiotics Rash   Adhesive [Tape] Rash   Benadryl [Diphenhydramine Hcl] Rash   Hydrocodone Rash   Prednisone Itching and Rash    Family History: Family History  Problem Relation Age of Onset   Cancer Father        ORAL    Heart attack Son        deceased   Breast cancer Neg Hx     Social History:  reports that she has never smoked. She has never used smokeless tobacco. She reports that she does not currently use alcohol. She reports that she does not use drugs.  ROS: All other review of systems were reviewed and are negative except what is noted above in HPI  Physical Exam: There were no vitals taken for this visit.  Constitutional:  Alert and oriented, No acute distress. HEENT: Jaime Weber, moist mucus membranes.  Trachea midline, no masses. Cardiovascular: No clubbing, cyanosis, or edema. Respiratory: Normal respiratory effort, no increased work of  breathing. GI: Abdomen is soft, nontender, nondistended, no abdominal masses GU: No CVA tenderness. Severe vaginal atrophy. 46m right labial cyst/mass that is nonpainful. Grade 1 cystocele Lymph: No cervical or inguinal lymphadenopathy. Skin: No rashes, bruises or suspicious lesions. Neurologic: Grossly intact, no focal deficits, moving all 4 extremities. Psychiatric: Normal mood and affect.  Laboratory Data: Lab Results  Component Value Date   WBC 9.1 03/12/2019   HGB 11.4 (L) 03/16/2019   HCT 35.8 (L) 03/16/2019   MCV 96.2 03/12/2019   PLT 247 03/12/2019    Lab Results  Component Value Date   CREATININE 0.68 03/16/2019    No results found for: PSA  No results found for: TESTOSTERONE  No  results found for: HGBA1C  Urinalysis    Component Value Date/Time   APPEARANCEUR Clear 01/28/2021 1033   GLUCOSEU Negative 01/28/2021 1033   BILIRUBINUR Negative 01/28/2021 1033   PROTEINUR Negative 01/28/2021 1033   NITRITE Negative 01/28/2021 1033   LEUKOCYTESUR Trace (A) 01/28/2021 1033    Lab Results  Component Value Date   LABMICR See below: 01/28/2021   WBCUA 6-10 (A) 01/28/2021   LABEPIT 0-10 01/28/2021   BACTERIA Few 01/28/2021    Pertinent Imaging:  No results found for this or any previous visit.  No results found for this or any previous visit.  No results found for this or any previous visit.  No results found for this or any previous visit.  No results found for this or any previous visit.  No results found for this or any previous visit.  No results found for this or any previous visit.  No results found for this or any previous visit.   Assessment & Plan:    1. Urinary incontinence in female Continue PTNS  2. Recurrent UTI Continue macrobid '50mg'$  qhs   No follow-ups on file.  Nicolette Bang, MD  [facility] [place]

## 2021-03-18 ENCOUNTER — Ambulatory Visit (INDEPENDENT_AMBULATORY_CARE_PROVIDER_SITE_OTHER): Payer: Medicare Other

## 2021-03-18 ENCOUNTER — Other Ambulatory Visit: Payer: Self-pay

## 2021-03-18 DIAGNOSIS — R32 Unspecified urinary incontinence: Secondary | ICD-10-CM | POA: Diagnosis not present

## 2021-03-18 NOTE — Progress Notes (Signed)
PTNS  Session # monthly  Health & Social Factors: none Caffeine: 1 cup Alcohol: none Daytime voids #per day: 6-7 Night-time voids #per night: all night Urgency: sometimes Incontinence Episodes #per day: all night Ankle used: right Treatment Setting: 6 Feeling/ Response: positive sensation from heel to toe Comments: n/a  Performed By: [facility] LPN  Assistant: none  Follow Up: keep scheduled monthly PTNS

## 2021-04-15 ENCOUNTER — Ambulatory Visit: Payer: Medicare Other

## 2021-04-22 ENCOUNTER — Ambulatory Visit (INDEPENDENT_AMBULATORY_CARE_PROVIDER_SITE_OTHER): Payer: Medicare Other

## 2021-04-22 ENCOUNTER — Other Ambulatory Visit: Payer: Self-pay

## 2021-04-22 DIAGNOSIS — R32 Unspecified urinary incontinence: Secondary | ICD-10-CM

## 2021-04-22 NOTE — Progress Notes (Signed)
PTNS  Session # 14 of 45  Health & Social Factors: Patient noted to have 2+pitting edema in bilateral lower extremities Caffeine: 1-2 cups daily Alcohol: none Daytime voids #per day: 7-8 Night-time voids #per night: all night Urgency: yes Incontinence Episodes #per day: 2-3 Ankle used:  Treatment Setting: 6 Feeling/ Response: positive  Comments: none  Performed By: [facility] LPN   Follow Up: keep next scheduled NV

## 2021-05-26 ENCOUNTER — Other Ambulatory Visit: Payer: Self-pay

## 2021-05-26 ENCOUNTER — Ambulatory Visit (INDEPENDENT_AMBULATORY_CARE_PROVIDER_SITE_OTHER): Payer: Medicare Other | Admitting: Urology

## 2021-05-26 ENCOUNTER — Encounter: Payer: Self-pay | Admitting: Urology

## 2021-05-26 VITALS — BP 116/67 | HR 89

## 2021-05-26 DIAGNOSIS — N390 Urinary tract infection, site not specified: Secondary | ICD-10-CM

## 2021-05-26 DIAGNOSIS — R32 Unspecified urinary incontinence: Secondary | ICD-10-CM

## 2021-05-26 LAB — MICROSCOPIC EXAMINATION
Epithelial Cells (non renal): >10 /hpf — AB (ref 0–10)
Renal Epithel, UA: NONE SEEN /hpf
WBC, UA: >30 /hpf — ABNORMAL HIGH (ref 0–5)

## 2021-05-26 LAB — URINALYSIS, ROUTINE W REFLEX MICROSCOPIC
Bilirubin, UA: NEGATIVE
Glucose, UA: NEGATIVE
Ketones, UA: NEGATIVE
Nitrite, UA: NEGATIVE
Protein,UA: NEGATIVE
Specific Gravity, UA: 1.02 (ref 1.005–1.030)
Urobilinogen, Ur: 0.2 mg/dL (ref 0.2–1.0)
pH, UA: 6.5 (ref 5.0–7.5)

## 2021-05-26 MED ORDER — DOXYCYCLINE HYCLATE 100 MG PO CAPS: 100.0000 mg | ORAL_CAPSULE | Freq: Two times a day (BID) | ORAL | 0 refills | Status: DC

## 2021-05-26 MED ORDER — NITROFURANTOIN MACROCRYSTAL 50 MG PO CAPS: 50.0000 mg | ORAL_CAPSULE | Freq: Every day | ORAL | 11 refills | Status: AC

## 2021-05-26 MED ORDER — GEMTESA 75 MG PO TABS: 1.0000 | ORAL_TABLET | Freq: Every day | ORAL | 0 refills | Status: DC

## 2021-05-26 NOTE — Progress Notes (Signed)
05/26/2021 2:34 PM   Jaime Weber 08/10/1940 [id number]  Referring provider: William Dalton, [place] [address]  Followup recurrent UTI and OAB   HPI: Jaime Weber is a 80yo here for followup for OAb and recurrent UTI. She is on maintenance PTNS and noted improvement in her daytime urge incontinence episodes. She has incontinence at night and wears a diaper. No UTIs since last visit. She is on macrobid 50mg  qhs   PMH: Past Medical History:  Diagnosis Date   Atypical mole 09/10/2013   left anterior knee mild   Borderline glaucoma    Constipation    Lesion of bladder    PONV (postoperative nausea and vomiting)    Prolapse of female pelvic organs    Rectocele    Wears glasses     Surgical History: Past Surgical History:  Procedure Laterality Date   BREAST BIOPSY     cataract bilateral   approx may 2019 then 2019,   COLONOSCOPY  2014   [place], negative per patient   CYSTOSCOPY N/A 03/15/2019   Procedure: CYSTOSCOPY;  Surgeon: Bjorn Loser, MD;  Location: [facility];  Service: Urology;  Laterality: N/A;   CYSTOSCOPY W/ RETROGRADES Bilateral 07/13/2015   Procedure: CYSTOSCOPY WITH BILATERAL RETROGRADE PYELOGRAM;  Surgeon: Cleon Gustin, MD;  Location: [facility];  Service: Urology;  Laterality: Bilateral;   CYSTOSCOPY WITH BIOPSY N/A 07/13/2015   Procedure: CYSTOSCOPY WITH BIOPSY WITH SELECTIVE CYTOLOGIES;  Surgeon: Cleon Gustin, MD;  Location: [facility];  Service: Urology;  Laterality: N/A;   ORIF RADIAL FRACTURE  07/05/2011   Procedure: OPEN REDUCTION INTERNAL FIXATION (ORIF) RADIAL FRACTURE;  Surgeon: Cammie Sickle., MD;  Location: [place];  Service: Orthopedics;  Laterality: Left;   PUBOVAGINAL SLING N/A 03/15/2019   Procedure: Gaynelle Arabian;  Surgeon: Bjorn Loser, MD;  Location: [facility];  Service: Urology;  Laterality: N/A;   ROBOTIC ASSISTED LAPAROSCOPIC  SACROCOLPOPEXY N/A 03/15/2019   Procedure: ROBOTIC ASSISTED LAPAROSCOPIC SACROCOLPOPEXY;  Surgeon: Ardis Hughs, MD;  Location: [facility];  Service: Urology;  Laterality: N/A;   TONSILLECTOMY  as child   VAGINAL HYSTERECTOMY  1980's    Home Medications:  Allergies as of 05/26/2021       Reactions   Cephalosporins Rash   Penicillins Rash   Did it involve swelling of the face/tongue/throat, SOB, or low BP? No Did it involve sudden or severe rash/hives, skin peeling, or any reaction on the inside of your mouth or nose? no Did you need to seek medical attention at a hospital or doctor's office? No When did it last happen? Many years ago       If all above answers are "NO", may proceed with cephalosporin use.   Sulfa Antibiotics Rash   Adhesive [tape] Rash   Benadryl [diphenhydramine Hcl] Rash   Hydrocodone Rash   Prednisone Itching, Rash        Medication List        Accurate as of May 26, 2021  2:34 PM. If you have any questions, ask your nurse or doctor.          STOP taking these medications    nitrofurantoin (macrocrystal-monohydrate) 100 MG capsule Commonly known as: MACROBID Stopped by: Nicolette Bang, MD       TAKE these medications    clobetasol 0.05 % external solution Commonly known as: TEMOVATE Apply 1 application topically 2 (two) times daily.   doxycycline 100  MG capsule Commonly known as: VIBRAMYCIN Take 1 capsule (100 mg total) by mouth every 12 (twelve) hours.   escitalopram 10 MG tablet Commonly known as: LEXAPRO Take 10 mg by mouth daily.   MAGNESIUM PO Take by mouth daily. Magnesium Optimizer   MAXI-CALM PO Take by mouth daily. Calm supplement for constipation   MULTI-ENZYME PO Take by mouth daily. Whole enzyme supplement   nitrofurantoin 50 MG capsule Commonly known as: MACRODANTIN Take 1 capsule (50 mg total) by mouth at bedtime.   ONE-A-DAY BONE STRENGTH PO Take by mouth daily.   solifenacin 10 MG tablet Commonly  known as: VESICARE Take 1 tablet (10 mg total) by mouth daily.   SUPER OMEGA 3 PO Take by mouth daily.   traMADol 50 MG tablet Commonly known as: Ultram Take 1-2 tablets (50-100 mg total) by mouth every 6 (six) hours as needed for moderate pain.   ULTRAFLORA IMMUNE HEALTH PO Take by mouth daily. Ultra-Flora Balance supplement   vitamin C 1000 MG tablet Take 1,000 mg by mouth daily.   VITAMIN D-3 PO Take by mouth daily.        Allergies:  Allergies  Allergen Reactions   Cephalosporins Rash   Penicillins Rash    Did it involve swelling of the face/tongue/throat, SOB, or low BP? No Did it involve sudden or severe rash/hives, skin peeling, or any reaction on the inside of your mouth or nose? no Did you need to seek medical attention at a hospital or doctor's office? No When did it last happen? Many years ago       If all above answers are "NO", may proceed with cephalosporin use.    Sulfa Antibiotics Rash   Adhesive [Tape] Rash   Benadryl [Diphenhydramine Hcl] Rash   Hydrocodone Rash   Prednisone Itching and Rash    Family History: Family History  Problem Relation Age of Onset   Cancer Father        ORAL    Heart attack Son        deceased   Breast cancer Neg Hx     Social History:  reports that she has never smoked. She has never used smokeless tobacco. She reports that she does not currently use alcohol. She reports that she does not use drugs.  ROS: All other review of systems were reviewed and are negative except what is noted above in HPI  Physical Exam: BP 116/67   Pulse 89   Constitutional:  Alert and oriented, No acute distress. HEENT: [place] AT, moist mucus membranes.  Trachea midline, no masses. Cardiovascular: No clubbing, cyanosis, or edema. Respiratory: Normal respiratory effort, no increased work of breathing. GI: Abdomen is soft, nontender, nondistended, no abdominal masses GU: No CVA tenderness.  Lymph: No cervical or inguinal  lymphadenopathy. Skin: No rashes, bruises or suspicious lesions. Neurologic: Grossly intact, no focal deficits, moving all 4 extremities. Psychiatric: Normal mood and affect.  Laboratory Data: Lab Results  Component Value Date   WBC 9.1 03/12/2019   HGB 11.4 (L) 03/16/2019   HCT 35.8 (L) 03/16/2019   MCV 96.2 03/12/2019   PLT 247 03/12/2019    Lab Results  Component Value Date   CREATININE 0.68 03/16/2019    No results found for: PSA  No results found for: TESTOSTERONE  No results found for: HGBA1C  Urinalysis    Component Value Date/Time   APPEARANCEUR Clear 01/28/2021 1033   GLUCOSEU Negative 01/28/2021 1033   BILIRUBINUR Negative 01/28/2021 1033   PROTEINUR Negative  01/28/2021 1033   NITRITE Negative 01/28/2021 1033   LEUKOCYTESUR Trace (A) 01/28/2021 1033    Lab Results  Component Value Date   LABMICR See below: 01/28/2021   WBCUA 6-10 (A) 01/28/2021   LABEPIT 0-10 01/28/2021   BACTERIA Few 01/28/2021    Pertinent Imaging:  No results found for this or any previous visit.  No results found for this or any previous visit.  No results found for this or any previous visit.  No results found for this or any previous visit.  No results found for this or any previous visit.  No results found for this or any previous visit.  No results found for this or any previous visit.  No results found for this or any previous visit.   Assessment & Plan:    1. Urinary incontinence in female -Continue maintenance PTNS.  Logan Bores 75mg  daily   2. Recurrent UTI -Urine for culture -doxycyline 100mg  BID for 7 days   No follow-ups on file.  Nicolette Bang, MD  [facility] [place]

## 2021-05-26 NOTE — Patient Instructions (Addendum)

## 2021-05-26 NOTE — Progress Notes (Signed)
PTNS  Session # 15 of 45  Health & Social Factors: none Caffeine: 1 cup of coffee Alcohol: none Daytime voids #per day: 9-10 Night-time voids #per night: wear pull ups Urgency: yes Incontinence Episodes #per day: ? Ankle used: right Treatment Setting: 5 Feeling/ Response: positive sensation Comments: n/a  Performed By: Astella Desir LPN    Follow Up: keep next scheduled NV

## 2021-05-26 NOTE — Progress Notes (Signed)
Urological Symptom Review  Patient is experiencing the following symptoms: Hard to postpone urination Get up at night to urinate   Review of Systems  Gastrointestinal (upper)  : Negative for upper GI symptoms  Gastrointestinal (lower) : Constipation  Constitutional : Negative for symptoms  Skin: Negative for skin symptoms  Eyes: Negative for eye symptoms  Ear/Nose/Throat : Negative for Ear/Nose/Throat symptoms  Hematologic/Lymphatic: Negative for Hematologic/Lymphatic symptoms  Cardiovascular : Leg swelling  Respiratory : Negative for respiratory symptoms  Endocrine: Negative for endocrine symptoms  Musculoskeletal: Negative for musculoskeletal symptoms  Neurological: Negative for neurological symptoms  Psychologic: Negative for psychiatric symptoms

## 2021-05-31 LAB — URINE CULTURE

## 2021-06-23 ENCOUNTER — Ambulatory Visit (INDEPENDENT_AMBULATORY_CARE_PROVIDER_SITE_OTHER): Payer: Medicare Other

## 2021-06-23 ENCOUNTER — Other Ambulatory Visit: Payer: Self-pay

## 2021-06-23 ENCOUNTER — Ambulatory Visit: Payer: Medicare Other | Admitting: Urology

## 2021-06-23 DIAGNOSIS — R32 Unspecified urinary incontinence: Secondary | ICD-10-CM | POA: Diagnosis not present

## 2021-06-23 NOTE — Progress Notes (Signed)
PTNS  Session # 16 of 45  Health & Social Factors: none Caffeine: 1-2 cups Alcohol: none Daytime voids #per day: 7-8 Night-time voids #per night: all night Urgency: yes Incontinence Episodes #per day: a few Ankle used: left Treatment Setting: 6 Feeling/ Response: heel tingling Comments: none  Performed By: Estill Bamberg RN  Follow Up: 1 month PTNS

## 2021-07-21 ENCOUNTER — Ambulatory Visit: Payer: Medicare Other

## 2021-07-26 ENCOUNTER — Other Ambulatory Visit: Payer: Self-pay | Admitting: Urology

## 2021-07-28 NOTE — Progress Notes (Addendum)
PTNS  Session # 17 of 45  Health & Social Factors: none Caffeine: 1 cup Alcohol: none Daytime voids #per day: 9-10 Night-time voids #per night: all night Urgency: yes Incontinence Episodes #per day: 0 Ankle used: right Treatment Setting: 12 Feeling/ Response: positive sensation  Comments: n/a  Performed By: [place] LPN  Follow Up: per PA note/ keep scheduled NV  Urine sent for MDX test  Pick up # [id number]

## 2021-07-29 ENCOUNTER — Other Ambulatory Visit: Payer: Self-pay

## 2021-07-29 ENCOUNTER — Ambulatory Visit (INDEPENDENT_AMBULATORY_CARE_PROVIDER_SITE_OTHER): Payer: Medicare Other | Admitting: Physician Assistant

## 2021-07-29 VITALS — BP 120/71 | HR 68

## 2021-07-29 DIAGNOSIS — N3001 Acute cystitis with hematuria: Secondary | ICD-10-CM | POA: Diagnosis not present

## 2021-07-29 DIAGNOSIS — N390 Urinary tract infection, site not specified: Secondary | ICD-10-CM | POA: Diagnosis not present

## 2021-07-29 DIAGNOSIS — R32 Unspecified urinary incontinence: Secondary | ICD-10-CM

## 2021-07-29 LAB — URINALYSIS, ROUTINE W REFLEX MICROSCOPIC
Bilirubin, UA: NEGATIVE
Glucose, UA: NEGATIVE
Ketones, UA: NEGATIVE
Nitrite, UA: POSITIVE — AB
Protein,UA: NEGATIVE
Specific Gravity, UA: 1.02 (ref 1.005–1.030)
Urobilinogen, Ur: 0.2 mg/dL (ref 0.2–1.0)
pH, UA: 6.5 (ref 5.0–7.5)

## 2021-07-29 LAB — MICROSCOPIC EXAMINATION: Renal Epithel, UA: NONE SEEN /hpf

## 2021-07-29 MED ORDER — DOXYCYCLINE HYCLATE 100 MG PO CAPS: 100.0000 mg | ORAL_CAPSULE | Freq: Two times a day (BID) | ORAL | 0 refills | Status: DC

## 2021-07-29 MED ORDER — FLUCONAZOLE 100 MG PO TABS: 100.0000 mg | ORAL_TABLET | Freq: Every day | ORAL | 0 refills | Status: AC

## 2021-07-29 NOTE — Progress Notes (Signed)
Assessment: 1. Urinary incontinence in female  2. Acute cystitis with hematuria  3. Recurrent UTI    Plan: She will continue PTNS treatments as previously planned.  Resolve MDX culture ordered.  She will hold at bedtime Macrobid and start Doxycycline until culture results available.  Will resume at bedtime dosing of antibiotic pending culture.  She is also noted to have yeast on her urinalysis today and will treat with Diflucan.  We will send prescription for Estrace cream pending resolution of current infection.  Chief Complaint: No chief complaint on file. PTNS  HPI: Jaime Weber is a 81 y.o. female who presents for PTNS tx and c/o recurrent urgency, nocturia, and frequency since stopping her Doxycycline last December for UTI. No incontinence. No gross hematuria. She denies fever, chills, NV, abdominal pain. She is on HS Macrodantin since last visit. Pt questions whether estrogen cream may be of benefit. She denies CAD, h/o breast CA.   UA 11-30 WBC, 11-30 RBC, many bacteria and +yeast. Nitrite positive  Cultures: 05/26/21 Enterococcus faecium 25-50K colonies pan sensitive 01/28/21 No growth 01/07/21 E. Coli 50-100K colonies Only sensitive to Augmentin, Ertapenem, Imipenem, Meropenim, Zosyn, Tetracycline, and Bactrim   02/23/21 Jaime Weber is a 81yo here for followup for urge incontinence. She completed 12 PTNS sessions and she noticed improvement starting after session 9. She notes resolution of her daytime incontinence but she continues to wear a pad at night. NO infections since last visit. No other complaints today.   05/26/21 Jaime Weber is a 81yo here for followup for OAb and recurrent UTI. She is on maintenance PTNS and noted improvement in her daytime urge incontinence episodes. She has incontinence at night and wears a diaper. No UTIs since last visit. She is on macrobid 50mg  qhs   Portions of the above documentation were copied from a prior visit for review purposes  only.  Allergies: Allergies  Allergen Reactions   Cephalosporins Rash   Penicillins Rash    Did it involve swelling of the face/tongue/throat, SOB, or low BP? No Did it involve sudden or severe rash/hives, skin peeling, or any reaction on the inside of your mouth or nose? no Did you need to seek medical attention at a hospital or doctor's office? No When did it last happen? Many years ago       If all above answers are NO, may proceed with cephalosporin use.    Sulfa Antibiotics Rash   Adhesive [Tape] Rash   Benadryl [Diphenhydramine Hcl] Rash   Hydrocodone Rash   Prednisone Itching and Rash    PMH: Past Medical History:  Diagnosis Date   Atypical mole 09/10/2013   left anterior knee mild   Borderline glaucoma    Constipation    Lesion of bladder    PONV (postoperative nausea and vomiting)    Prolapse of female pelvic organs    Rectocele    Wears glasses     PSH: Past Surgical History:  Procedure Laterality Date   BREAST BIOPSY     cataract bilateral   approx may 2019 then 2019,   COLONOSCOPY  2014   [place], negative per patient   CYSTOSCOPY N/A 03/15/2019   Procedure: CYSTOSCOPY;  Surgeon: Bjorn Loser, MD;  Location: [facility];  Service: Urology;  Laterality: N/A;   CYSTOSCOPY W/ RETROGRADES Bilateral 07/13/2015   Procedure: CYSTOSCOPY WITH BILATERAL RETROGRADE PYELOGRAM;  Surgeon: Cleon Gustin, MD;  Location: [facility];  Service: Urology;  Laterality: Bilateral;   CYSTOSCOPY WITH  BIOPSY N/A 07/13/2015   Procedure: CYSTOSCOPY WITH BIOPSY WITH SELECTIVE CYTOLOGIES;  Surgeon: Cleon Gustin, MD;  Location: [facility];  Service: Urology;  Laterality: N/A;   ORIF RADIAL FRACTURE  07/05/2011   Procedure: OPEN REDUCTION INTERNAL FIXATION (ORIF) RADIAL FRACTURE;  Surgeon: Cammie Sickle., MD;  Location: [facility];  Service: Orthopedics;  Laterality: Left;   PUBOVAGINAL SLING N/A 03/15/2019   Procedure:  Gaynelle Arabian;  Surgeon: Bjorn Loser, MD;  Location: [facility];  Service: Urology;  Laterality: N/A;   ROBOTIC ASSISTED LAPAROSCOPIC SACROCOLPOPEXY N/A 03/15/2019   Procedure: ROBOTIC ASSISTED LAPAROSCOPIC SACROCOLPOPEXY;  Surgeon: Ardis Hughs, MD;  Location: [facility];  Service: Urology;  Laterality: N/A;   TONSILLECTOMY  as child   VAGINAL HYSTERECTOMY  1980's    SH: Social History   Tobacco Use   Smoking status: Never   Smokeless tobacco: Never  Vaping Use   Vaping Use: Never used  Substance Use Topics   Alcohol use: Not Currently    Alcohol/week: 0.0 standard drinks    Comment: rare- wine ocassional   Drug use: No    ROS: Constitutional:  Negative for fever, chills, weight loss CV: Negative for chest pain, previous MI, hypertension Respiratory:  Negative for shortness of breath, wheezing, sleep apnea, frequent cough GI:  Negative for nausea, vomiting, bloody stool, GERD  PE: BP 120/71    Pulse 68  GENERAL APPEARANCE:  Well appearing, well developed, well nourished, NAD HEENT:  Atraumatic, normocephalic NECK:  Supple. Trachea midline ABDOMEN:  Soft, non-tender, no masses EXTREMITIES:  Moves all extremities well, without clubbing, cyanosis. LLE edema noted NEUROLOGIC:  Alert and oriented x 3, normal gait, CN II-XII grossly intact MENTAL STATUS:  appropriate BACK:  Non-tender to palpation, No CVAT SKIN:  Warm, dry, and intact   Results: Laboratory Data: Lab Results  Component Value Date   WBC 9.1 03/12/2019   HGB 11.4 (L) 03/16/2019   HCT 35.8 (L) 03/16/2019   MCV 96.2 03/12/2019   PLT 247 03/12/2019    Lab Results  Component Value Date   CREATININE 0.68 03/16/2019    Urinalysis    Component Value Date/Time   APPEARANCEUR Cloudy (A) 05/26/2021 1523   GLUCOSEU Negative 05/26/2021 1523   BILIRUBINUR Negative 05/26/2021 1523   PROTEINUR Negative 05/26/2021 1523   NITRITE Negative 05/26/2021 1523   LEUKOCYTESUR 2+ (A) 05/26/2021 1523    Lab  Results  Component Value Date   LABMICR See below: 05/26/2021   WBCUA >30 (H) 05/26/2021   LABEPIT >10 (A) 05/26/2021   BACTERIA Many (A) 05/26/2021    Pertinent Imaging:  No results found for this or any previous visit.  No results found for this or any previous visit.  No results found for this or any previous visit.  No results found for this or any previous visit.  No results found for this or any previous visit.  No results found for this or any previous visit.  No results found for this or any previous visit.  No results found for this or any previous visit.  No results found for this or any previous visit (from the past 24 hour(s)).

## 2021-08-04 ENCOUNTER — Other Ambulatory Visit: Payer: Self-pay | Admitting: Physician Assistant

## 2021-08-26 ENCOUNTER — Other Ambulatory Visit: Payer: Self-pay

## 2021-08-26 ENCOUNTER — Ambulatory Visit (INDEPENDENT_AMBULATORY_CARE_PROVIDER_SITE_OTHER): Payer: Medicare Other

## 2021-08-26 DIAGNOSIS — R32 Unspecified urinary incontinence: Secondary | ICD-10-CM

## 2021-08-26 DIAGNOSIS — N390 Urinary tract infection, site not specified: Secondary | ICD-10-CM

## 2021-08-26 MED ORDER — NITROFURANTOIN MONOHYD MACRO 100 MG PO CAPS: 100.0000 mg | ORAL_CAPSULE | Freq: Two times a day (BID) | ORAL | 0 refills | Status: AC

## 2021-08-26 NOTE — Addendum Note (Signed)
Addended byIris Pert on: 08/26/2021 09:36 AM ? ? Modules accepted: Orders ? ?

## 2021-08-26 NOTE — Progress Notes (Signed)
PTNS ? ?Session # 18 of 61 ? ?Health & Social Factors: none ?Caffeine: none ?Alcohol: none ?Daytime voids #per day: 8-10 ?Night-time voids #per night: all night ?Urgency: yes ?Incontinence Episodes #per day: 3-4 times a week ?Ankle used: left ?Treatment Setting: 2 ?Feeling/ Response: toe curl ?Comments: n/a ? ?Performed By: [facility] LPN ? ?Follow Up: Keep scheduled PTNS ?

## 2021-08-27 ENCOUNTER — Other Ambulatory Visit: Payer: Self-pay | Admitting: *Deleted

## 2021-08-27 DIAGNOSIS — R32 Unspecified urinary incontinence: Secondary | ICD-10-CM

## 2021-08-27 DIAGNOSIS — N390 Urinary tract infection, site not specified: Secondary | ICD-10-CM

## 2021-08-27 LAB — MICROSCOPIC EXAMINATION
Renal Epithel, UA: NONE SEEN /hpf
WBC, UA: >30 /hpf — AB (ref 0–5)

## 2021-08-27 LAB — URINALYSIS, ROUTINE W REFLEX MICROSCOPIC
Bilirubin, UA: NEGATIVE
Glucose, UA: NEGATIVE
Ketones, UA: NEGATIVE
Nitrite, UA: NEGATIVE
Protein,UA: NEGATIVE
Specific Gravity, UA: 1.02 (ref 1.005–1.030)
Urobilinogen, Ur: 0.2 mg/dL (ref 0.2–1.0)
pH, UA: 6 (ref 5.0–7.5)

## 2021-08-28 LAB — URINE CULTURE: Organism ID, Bacteria: NO GROWTH

## 2021-09-02 ENCOUNTER — Telehealth: Payer: Self-pay

## 2021-09-02 NOTE — Telephone Encounter (Signed)
Patient called and advised she needed someone to call her regarding the urine sample she left last week.  ?

## 2021-09-03 NOTE — Telephone Encounter (Signed)
Patient made aware of negative urine culture. ?

## 2021-09-09 ENCOUNTER — Other Ambulatory Visit: Payer: Self-pay | Admitting: Physician Assistant

## 2021-09-10 ENCOUNTER — Other Ambulatory Visit: Payer: Self-pay

## 2021-09-10 MED ORDER — SOLIFENACIN SUCCINATE 10 MG PO TABS: 10.0000 mg | ORAL_TABLET | Freq: Every day | ORAL | 3 refills | Status: DC

## 2021-09-24 ENCOUNTER — Ambulatory Visit (INDEPENDENT_AMBULATORY_CARE_PROVIDER_SITE_OTHER): Payer: Medicare Other

## 2021-09-24 DIAGNOSIS — R32 Unspecified urinary incontinence: Secondary | ICD-10-CM | POA: Diagnosis not present

## 2021-09-24 NOTE — Progress Notes (Signed)
PTNS ? ?Session # 19 of 22 ? ?Health & Social Factors: No ?Caffeine: 1 cup of coffee ?Alcohol: no ?Daytime voids #per day: 9-11 ?Night-time voids #per night: wet in pads ?Urgency: yes ?Incontinence Episodes #per day: 2-3 ?Ankle used: left ?Treatment Setting: 9 ?Feeling/ Response: feel it in my toes ?Comments: n/a ? ?Performed By: Estill Bamberg, RN ? ?Follow Up: keep next scheduled apt.  ?

## 2021-10-25 ENCOUNTER — Ambulatory Visit: Payer: PRIVATE HEALTH INSURANCE

## 2021-10-29 ENCOUNTER — Ambulatory Visit: Payer: PRIVATE HEALTH INSURANCE

## 2021-11-25 ENCOUNTER — Ambulatory Visit: Payer: Medicare Other

## 2021-12-02 ENCOUNTER — Ambulatory Visit: Payer: PRIVATE HEALTH INSURANCE | Admitting: Physician Assistant

## 2021-12-16 ENCOUNTER — Other Ambulatory Visit: Payer: Self-pay | Admitting: Nurse Practitioner

## 2021-12-16 DIAGNOSIS — Z1231 Encounter for screening mammogram for malignant neoplasm of breast: Secondary | ICD-10-CM

## 2021-12-30 ENCOUNTER — Ambulatory Visit
Admission: RE | Admit: 2021-12-30 | Discharge: 2021-12-30 | Disposition: A | Discharge status: Home / Self Care | Payer: Medicare Other | Source: Ambulatory Visit | Attending: Nurse Practitioner | Admitting: Nurse Practitioner

## 2021-12-30 DIAGNOSIS — Z1231 Encounter for screening mammogram for malignant neoplasm of breast: Secondary | ICD-10-CM

## 2022-01-05 ENCOUNTER — Ambulatory Visit (INDEPENDENT_AMBULATORY_CARE_PROVIDER_SITE_OTHER): Payer: Medicare Other | Admitting: Physician Assistant

## 2022-01-05 ENCOUNTER — Encounter: Payer: Self-pay | Admitting: Physician Assistant

## 2022-01-05 DIAGNOSIS — D485 Neoplasm of uncertain behavior of skin: Secondary | ICD-10-CM

## 2022-01-05 DIAGNOSIS — S20462A Insect bite (nonvenomous) of left back wall of thorax, initial encounter: Secondary | ICD-10-CM | POA: Diagnosis not present

## 2022-01-05 DIAGNOSIS — W57XXXA Bitten or stung by nonvenomous insect and other nonvenomous arthropods, initial encounter: Secondary | ICD-10-CM

## 2022-01-05 DIAGNOSIS — Z1283 Encounter for screening for malignant neoplasm of skin: Secondary | ICD-10-CM

## 2022-01-05 DIAGNOSIS — L820 Inflamed seborrheic keratosis: Secondary | ICD-10-CM | POA: Diagnosis not present

## 2022-01-05 MED ORDER — TRIAMCINOLONE ACETONIDE 0.1 % EX CREA: 1.0000 | TOPICAL_CREAM | Freq: Two times a day (BID) | CUTANEOUS | 3 refills | Status: AC | PRN

## 2022-01-05 NOTE — Patient Instructions (Signed)

## 2022-01-12 ENCOUNTER — Ambulatory Visit (INDEPENDENT_AMBULATORY_CARE_PROVIDER_SITE_OTHER): Payer: Medicare Other | Admitting: Urology

## 2022-01-12 ENCOUNTER — Encounter: Payer: Self-pay | Admitting: Urology

## 2022-01-12 VITALS — BP 136/78 | HR 63

## 2022-01-12 DIAGNOSIS — N390 Urinary tract infection, site not specified: Secondary | ICD-10-CM

## 2022-01-12 LAB — URINALYSIS, ROUTINE W REFLEX MICROSCOPIC
Bilirubin, UA: NEGATIVE
Glucose, UA: NEGATIVE
Ketones, UA: NEGATIVE
Nitrite, UA: POSITIVE — AB
Protein,UA: NEGATIVE
Specific Gravity, UA: 1.01 (ref 1.005–1.030)
Urobilinogen, Ur: 0.2 mg/dL (ref 0.2–1.0)
pH, UA: 6 (ref 5.0–7.5)

## 2022-01-12 LAB — MICROSCOPIC EXAMINATION
Renal Epithel, UA: NONE SEEN /hpf
WBC, UA: >30 /hpf — AB (ref 0–5)

## 2022-01-12 LAB — BLADDER SCAN AMB NON-IMAGING: Scan Result: 185

## 2022-01-12 MED ORDER — DOXYCYCLINE HYCLATE 100 MG PO CAPS: 100.0000 mg | ORAL_CAPSULE | Freq: Two times a day (BID) | ORAL | 0 refills | Status: AC

## 2022-01-12 NOTE — Progress Notes (Signed)
01/12/2022 9:13 AM   Jaime Weber Sep 22, 1940 [id number]  Referring provider: William Dalton, [place] [address]  Followup OAB and recurrent UTI   HPI: Jaime Weber is a 81yo here for followup for recurrent UTI and OAB. UA today is concerning for infection. She notews worsening urinary urgency, frequency every 45 minutes and urge incontinence over the past 2 weeks. She has terminal dysuria. She is very unhappy with her incontinence.    PMH: Past Medical History:  Diagnosis Date   Atypical mole 09/10/2013   left anterior knee mild   Borderline glaucoma    Constipation    Lesion of bladder    PONV (postoperative nausea and vomiting)    Prolapse of female pelvic organs    Rectocele    Wears glasses     Surgical History: Past Surgical History:  Procedure Laterality Date   BREAST BIOPSY Right 04/13/2011   cataract bilateral   approx may 2019 then 2019,   COLONOSCOPY  2014   [place], negative per patient   CYSTOSCOPY N/A 03/15/2019   Procedure: CYSTOSCOPY;  Surgeon: Bjorn Loser, MD;  Location: [facility];  Service: Urology;  Laterality: N/A;   CYSTOSCOPY W/ RETROGRADES Bilateral 07/13/2015   Procedure: CYSTOSCOPY WITH BILATERAL RETROGRADE PYELOGRAM;  Surgeon: Cleon Gustin, MD;  Location: [facility];  Service: Urology;  Laterality: Bilateral;   CYSTOSCOPY WITH BIOPSY N/A 07/13/2015   Procedure: CYSTOSCOPY WITH BIOPSY WITH SELECTIVE CYTOLOGIES;  Surgeon: Cleon Gustin, MD;  Location: [facility];  Service: Urology;  Laterality: N/A;   ORIF RADIAL FRACTURE  07/05/2011   Procedure: OPEN REDUCTION INTERNAL FIXATION (ORIF) RADIAL FRACTURE;  Surgeon: Cammie Sickle., MD;  Location: [facility];  Service: Orthopedics;  Laterality: Left;   PUBOVAGINAL SLING N/A 03/15/2019   Procedure: Gaynelle Arabian;  Surgeon: Bjorn Loser, MD;  Location: [facility];  Service: Urology;  Laterality: N/A;    ROBOTIC ASSISTED LAPAROSCOPIC SACROCOLPOPEXY N/A 03/15/2019   Procedure: ROBOTIC ASSISTED LAPAROSCOPIC SACROCOLPOPEXY;  Surgeon: Ardis Hughs, MD;  Location: [facility];  Service: Urology;  Laterality: N/A;   TONSILLECTOMY  as child   VAGINAL HYSTERECTOMY  1980's    Home Medications:  Allergies as of 01/12/2022       Reactions   Cephalosporins Rash   Penicillins Rash   Did it involve swelling of the face/tongue/throat, SOB, or low BP? No Did it involve sudden or severe rash/hives, skin peeling, or any reaction on the inside of your mouth or nose? no Did you need to seek medical attention at a hospital or doctor's office? No When did it last happen? Many years ago       If all above answers are "NO", may proceed with cephalosporin use.   Sulfa Antibiotics Rash   Hydrocodone Rash        Medication List        Accurate as of January 12, 2022  9:13 AM. If you have any questions, ask your nurse or doctor.          escitalopram 20 MG tablet Commonly known as: LEXAPRO Take 20 mg by mouth daily.   MAGNESIUM PO Take by mouth daily. Magnesium Optimizer   MAXI-CALM PO Take by mouth daily. Calm supplement for constipation   MULTI-ENZYME PO Take by mouth daily. Whole enzyme supplement   nitrofurantoin (macrocrystal-monohydrate) 100 MG capsule Commonly known as: MACROBID Take 1 capsule (100 mg total) by mouth 2 (two) times  daily. Stop nightly macrodantin until 7 day nitrofurantoin is complete.   nitrofurantoin 50 MG capsule Commonly known as: MACRODANTIN Take 1 capsule (50 mg total) by mouth at bedtime.   ONE-A-DAY BONE STRENGTH PO Take by mouth daily.   solifenacin 10 MG tablet Commonly known as: VESICARE Take 1 tablet (10 mg total) by mouth daily.   SUPER OMEGA 3 PO Take by mouth daily.   triamcinolone cream 0.1 % Commonly known as: KENALOG Apply 1 Application topically 2 (two) times daily as needed.   ULTRAFLORA IMMUNE HEALTH PO Take by mouth daily.  Ultra-Flora Balance supplement   vitamin C 1000 MG tablet Take 1,000 mg by mouth daily.   VITAMIN D-3 PO Take by mouth daily.        Allergies:  Allergies  Allergen Reactions   Cephalosporins Rash   Penicillins Rash    Did it involve swelling of the face/tongue/throat, SOB, or low BP? No Did it involve sudden or severe rash/hives, skin peeling, or any reaction on the inside of your mouth or nose? no Did you need to seek medical attention at a hospital or doctor's office? No When did it last happen? Many years ago       If all above answers are "NO", may proceed with cephalosporin use.    Sulfa Antibiotics Rash   Hydrocodone Rash    Family History: Family History  Problem Relation Age of Onset   Cancer Father        ORAL    Heart attack Son        deceased   Breast cancer Neg Hx     Social History:  reports that she has never smoked. She has never used smokeless tobacco. She reports that she does not currently use alcohol. She reports that she does not use drugs.  ROS: All other review of systems were reviewed and are negative except what is noted above in HPI  Physical Exam: BP 136/78   Pulse 63   Constitutional:  Alert and oriented, No acute distress. HEENT: [place] AT, moist mucus membranes.  Trachea midline, no masses. Cardiovascular: No clubbing, cyanosis, or edema. Respiratory: Normal respiratory effort, no increased work of breathing. GI: Abdomen is soft, nontender, nondistended, no abdominal masses GU: No CVA tenderness.  Lymph: No cervical or inguinal lymphadenopathy. Skin: No rashes, bruises or suspicious lesions. Neurologic: Grossly intact, no focal deficits, moving all 4 extremities. Psychiatric: Normal mood and affect.  Laboratory Data: Lab Results  Component Value Date   WBC 9.1 03/12/2019   HGB 11.4 (L) 03/16/2019   HCT 35.8 (L) 03/16/2019   MCV 96.2 03/12/2019   PLT 247 03/12/2019    Lab Results  Component Value Date   CREATININE 0.68  03/16/2019    No results found for: "PSA"  No results found for: "TESTOSTERONE"  No results found for: "HGBA1C"  Urinalysis    Component Value Date/Time   APPEARANCEUR Hazy (A) 08/27/2021 1311   GLUCOSEU Negative 08/27/2021 1311   BILIRUBINUR Negative 08/27/2021 1311   PROTEINUR Negative 08/27/2021 1311   NITRITE Negative 08/27/2021 1311   LEUKOCYTESUR 3+ (A) 08/27/2021 1311    Lab Results  Component Value Date   LABMICR See below: 08/27/2021   WBCUA >30 (A) 08/27/2021   LABEPIT 0-10 08/27/2021   BACTERIA Moderate (A) 08/27/2021    Pertinent Imaging:  No results found for this or any previous visit.  No results found for this or any previous visit.  No results found for this or any  previous visit.  No results found for this or any previous visit.  No results found for this or any previous visit.  No results found for this or any previous visit.  No results found for this or any previous visit.  No results found for this or any previous visit.   Assessment & Plan:    1. Recurrent UTI Urine for culture -doxycycline '100mg'$  BID for 7 days - Urinalysis, Routine w reflex microscopic - BLADDER SCAN AMB NON-IMAGING - Urine Culture   No follow-ups on file.  Nicolette Bang, MD  [facility]

## 2022-01-12 NOTE — Patient Instructions (Signed)

## 2022-01-12 NOTE — Progress Notes (Signed)
post void residual=185 

## 2022-01-14 LAB — URINE CULTURE

## 2022-01-17 ENCOUNTER — Telehealth: Payer: Self-pay

## 2022-01-17 NOTE — Telephone Encounter (Signed)
Phone call to patient with her pathology results. Patient aware of results.  

## 2022-01-17 NOTE — Telephone Encounter (Signed)
-----   Message from Warren Danes, [place] sent at 01/13/2022  4:55 PM EDT ----- Benign

## 2022-01-26 ENCOUNTER — Encounter: Payer: Self-pay | Admitting: Physician Assistant

## 2022-01-26 NOTE — Progress Notes (Signed)
   Follow-Up Visit   Subjective  Jaime Weber is a 81 y.o. female who presents for the following: Annual Exam (Patient here today for skin check, per patient she's noticed several lesions over her body that she would like checked. ).   The following portions of the chart were reviewed this encounter and updated as appropriate:  Tobacco  Allergies  Meds  Problems  Med Hx  Surg Hx  Fam Hx      Objective  Well appearing patient in no apparent distress; mood and affect are within normal limits.  A full examination was performed including scalp, head, eyes, ears, nose, lips, neck, chest, axillae, abdomen, back, buttocks, bilateral upper extremities, bilateral lower extremities, hands, feet, fingers, toes, fingernails, and toenails. All findings within normal limits unless otherwise noted below.  Full body skin examinationNo atypical nevi or signs of NMSC noted at the time of the visit. -   Right Upper Arm - Posterior Thick crust on an erythematous base.    Assessment & Plan  Neoplasm of uncertain behavior of skin Right Upper Arm - Posterior  Skin / nail biopsy Type of biopsy: tangential   Informed consent: discussed and consent obtained   Timeout: patient name, date of birth, surgical site, and procedure verified   Procedure prep:  Patient was prepped and draped in usual sterile fashion (Non sterile) Prep type:  Chlorhexidine Anesthesia: the lesion was anesthetized in a standard fashion   Anesthetic:  1% lidocaine w/ epinephrine 1-100,000 local infiltration Instrument used: flexible razor blade   Outcome: patient tolerated procedure well   Post-procedure details: wound care instructions given    Specimen 1 - Surgical pathology Differential Diagnosis: r/o sk  Check Margins: No  Bug bite without infection, initial encounter Right Upper Back  triamcinolone cream (KENALOG) 0.1 % - Right Upper Back Apply 1 Application topically 2 (two) times daily as needed.  Encounter  for screening for malignant neoplasm of skin  Yearly skin examination    I, Marleni Gallardo, PA-C, have reviewed all documentation's for this visit.  The documentation on 01/26/22 for the exam, diagnosis, procedures and orders are all accurate and complete.

## 2022-01-28 ENCOUNTER — Ambulatory Visit (INDEPENDENT_AMBULATORY_CARE_PROVIDER_SITE_OTHER): Payer: Medicare Other | Admitting: Urology

## 2022-01-28 ENCOUNTER — Encounter: Payer: Self-pay | Admitting: Urology

## 2022-01-28 VITALS — BP 111/70 | HR 71

## 2022-01-28 DIAGNOSIS — R339 Retention of urine, unspecified: Secondary | ICD-10-CM

## 2022-01-28 DIAGNOSIS — R32 Unspecified urinary incontinence: Secondary | ICD-10-CM

## 2022-01-28 DIAGNOSIS — N3281 Overactive bladder: Secondary | ICD-10-CM | POA: Diagnosis not present

## 2022-01-28 DIAGNOSIS — Z87440 Personal history of urinary (tract) infections: Secondary | ICD-10-CM

## 2022-01-28 DIAGNOSIS — N390 Urinary tract infection, site not specified: Secondary | ICD-10-CM

## 2022-01-28 LAB — URINALYSIS, ROUTINE W REFLEX MICROSCOPIC
Bilirubin, UA: NEGATIVE
Glucose, UA: NEGATIVE
Ketones, UA: NEGATIVE
Nitrite, UA: NEGATIVE
Protein,UA: NEGATIVE
Specific Gravity, UA: 1.015 (ref 1.005–1.030)
Urobilinogen, Ur: 0.2 mg/dL (ref 0.2–1.0)
pH, UA: 6 (ref 5.0–7.5)

## 2022-01-28 LAB — MICROSCOPIC EXAMINATION: Renal Epithel, UA: NONE SEEN /hpf

## 2022-01-28 LAB — BLADDER SCAN AMB NON-IMAGING: Scan Result: 273

## 2022-01-28 MED ORDER — ALFUZOSIN HCL ER 10 MG PO TB24: 10.0000 mg | ORAL_TABLET | Freq: Every day | ORAL | 11 refills | Status: DC

## 2022-01-28 NOTE — Progress Notes (Signed)
post void residual=273

## 2022-01-28 NOTE — Patient Instructions (Signed)

## 2022-01-28 NOTE — Progress Notes (Signed)
01/28/2022 9:25 AM   Jaime Weber 1940/10/19 [id number]  Referring provider: William Dalton, [place] [address]  Followup dysuria and incomplete emptying   HPI: [name] is a 81yo here for followup for dysuria, OAB and incomplete emptying. Dysuria resolved. She has very few incontinent episodes on vesicare. PVR 273 today. No other complaints   PMH: Past Medical History:  Diagnosis Date   Atypical mole 09/10/2013   left anterior knee mild   Borderline glaucoma    Constipation    Lesion of bladder    PONV (postoperative nausea and vomiting)    Prolapse of female pelvic organs    Rectocele    Wears glasses     Surgical History: Past Surgical History:  Procedure Laterality Date   BREAST BIOPSY Right 04/13/2011   cataract bilateral   approx may 2019 then 2019,   COLONOSCOPY  2014   [place], negative per patient   CYSTOSCOPY N/A 03/15/2019   Procedure: CYSTOSCOPY;  Surgeon: Bjorn Loser, MD;  Location: [facility];  Service: Urology;  Laterality: N/A;   CYSTOSCOPY W/ RETROGRADES Bilateral 07/13/2015   Procedure: CYSTOSCOPY WITH BILATERAL RETROGRADE PYELOGRAM;  Surgeon: Cleon Gustin, MD;  Location: [facility];  Service: Urology;  Laterality: Bilateral;   CYSTOSCOPY WITH BIOPSY N/A 07/13/2015   Procedure: CYSTOSCOPY WITH BIOPSY WITH SELECTIVE CYTOLOGIES;  Surgeon: Cleon Gustin, MD;  Location: [facility];  Service: Urology;  Laterality: N/A;   ORIF RADIAL FRACTURE  07/05/2011   Procedure: OPEN REDUCTION INTERNAL FIXATION (ORIF) RADIAL FRACTURE;  Surgeon: Cammie Sickle., MD;  Location: [place];  Service: Orthopedics;  Laterality: Left;   PUBOVAGINAL SLING N/A 03/15/2019   Procedure: Gaynelle Arabian;  Surgeon: Bjorn Loser, MD;  Location: [facility];  Service: Urology;  Laterality: N/A;   ROBOTIC ASSISTED LAPAROSCOPIC SACROCOLPOPEXY N/A 03/15/2019   Procedure: ROBOTIC  ASSISTED LAPAROSCOPIC SACROCOLPOPEXY;  Surgeon: Ardis Hughs, MD;  Location: [facility];  Service: Urology;  Laterality: N/A;   TONSILLECTOMY  as child   VAGINAL HYSTERECTOMY  1980's    Home Medications:  Allergies as of 01/28/2022       Reactions   Cephalosporins Rash   Penicillins Rash   Did it involve swelling of the face/tongue/throat, SOB, or low BP? No Did it involve sudden or severe rash/hives, skin peeling, or any reaction on the inside of your mouth or nose? no Did you need to seek medical attention at a hospital or doctor's office? No When did it last happen? Many years ago       If all above answers are "NO", may proceed with cephalosporin use.   Sulfa Antibiotics Rash   Hydrocodone Rash        Medication List        Accurate as of January 28, 2022  9:25 AM. If you have any questions, ask your nurse or doctor.          doxycycline 100 MG capsule Commonly known as: VIBRAMYCIN Take 1 capsule (100 mg total) by mouth every 12 (twelve) hours.   escitalopram 20 MG tablet Commonly known as: LEXAPRO Take 20 mg by mouth daily.   MAGNESIUM PO Take by mouth daily. Magnesium Optimizer   MAXI-CALM PO Take by mouth daily. Calm supplement for constipation   MULTI-ENZYME PO Take by mouth daily. Whole enzyme supplement   nitrofurantoin (macrocrystal-monohydrate) 100 MG capsule Commonly known as: MACROBID Take 1 capsule (100 mg total) by  mouth 2 (two) times daily. Stop nightly macrodantin until 7 day nitrofurantoin is complete.   nitrofurantoin 50 MG capsule Commonly known as: MACRODANTIN Take 1 capsule (50 mg total) by mouth at bedtime.   ONE-A-DAY BONE STRENGTH PO Take by mouth daily.   solifenacin 10 MG tablet Commonly known as: VESICARE Take 1 tablet (10 mg total) by mouth daily.   SUPER OMEGA 3 PO Take by mouth daily.   triamcinolone cream 0.1 % Commonly known as: KENALOG Apply 1 Application topically 2 (two) times daily as needed.   ULTRAFLORA  IMMUNE HEALTH PO Take by mouth daily. Ultra-Flora Balance supplement   vitamin C 1000 MG tablet Take 1,000 mg by mouth daily.   VITAMIN D-3 PO Take by mouth daily.        Allergies:  Allergies  Allergen Reactions   Cephalosporins Rash   Penicillins Rash    Did it involve swelling of the face/tongue/throat, SOB, or low BP? No Did it involve sudden or severe rash/hives, skin peeling, or any reaction on the inside of your mouth or nose? no Did you need to seek medical attention at a hospital or doctor's office? No When did it last happen? Many years ago       If all above answers are "NO", may proceed with cephalosporin use.    Sulfa Antibiotics Rash   Hydrocodone Rash    Family History: Family History  Problem Relation Age of Onset   Cancer Father        ORAL    Heart attack Son        deceased   Breast cancer Neg Hx     Social History:  reports that she has never smoked. She has never used smokeless tobacco. She reports that she does not currently use alcohol. She reports that she does not use drugs.  ROS: All other review of systems were reviewed and are negative except what is noted above in HPI  Physical Exam: BP 111/70   Pulse 71   Constitutional:  Alert and oriented, No acute distress. HEENT: [place] AT, moist mucus membranes.  Trachea midline, no masses. Cardiovascular: No clubbing, cyanosis, or edema. Respiratory: Normal respiratory effort, no increased work of breathing. GI: Abdomen is soft, nontender, nondistended, no abdominal masses GU: No CVA tenderness.  Lymph: No cervical or inguinal lymphadenopathy. Skin: No rashes, bruises or suspicious lesions. Neurologic: Grossly intact, no focal deficits, moving all 4 extremities. Psychiatric: Normal mood and affect.  Laboratory Data: Lab Results  Component Value Date   WBC 9.1 03/12/2019   HGB 11.4 (L) 03/16/2019   HCT 35.8 (L) 03/16/2019   MCV 96.2 03/12/2019   PLT 247 03/12/2019    Lab Results   Component Value Date   CREATININE 0.68 03/16/2019    No results found for: "PSA"  No results found for: "TESTOSTERONE"  No results found for: "HGBA1C"  Urinalysis    Component Value Date/Time   APPEARANCEUR Cloudy (A) 01/12/2022 0903   GLUCOSEU Negative 01/12/2022 0903   BILIRUBINUR Negative 01/12/2022 0903   PROTEINUR Negative 01/12/2022 0903   NITRITE Positive (A) 01/12/2022 0903   LEUKOCYTESUR 3+ (A) 01/12/2022 0903    Lab Results  Component Value Date   LABMICR See below: 01/12/2022   WBCUA >30 (A) 01/12/2022   LABEPIT 0-10 01/12/2022   BACTERIA Many (A) 01/12/2022    Pertinent Imaging:  No results found for this or any previous visit.  No results found for this or any previous visit.  No results  found for this or any previous visit.  No results found for this or any previous visit.  No results found for this or any previous visit.  No results found for this or any previous visit.  No results found for this or any previous visit.  No results found for this or any previous visit.   Assessment & Plan:    1. Recurrent UTI -UA today - BLADDER SCAN AMB NON-IMAGING - Urinalysis, Routine w reflex microscopic  2. OAB -continue vesicare '10mg'$   3. Incomplete emptying -we will trial uroxatral '10mg'$  daily   No follow-ups on file.  Nicolette Bang, MD  [facility]rology [place]

## 2022-02-09 ENCOUNTER — Telehealth: Payer: Self-pay

## 2022-02-09 ENCOUNTER — Other Ambulatory Visit: Payer: Self-pay | Admitting: Urology

## 2022-02-09 NOTE — Telephone Encounter (Signed)
Patient called this am with dizziness after starting alfuzosin. Reports that she fell at home after started the medication.  Patient has stopped the alfuzosin. Patient still feels uneasy and balance is still off.  Patient instructed to call PCP to discuss symptoms and symptoms worsen to go to nearest ER.   Patient voiced understanding.

## 2022-02-11 ENCOUNTER — Other Ambulatory Visit: Payer: Self-pay

## 2022-02-11 MED ORDER — SOLIFENACIN SUCCINATE 10 MG PO TABS: 10.0000 mg | ORAL_TABLET | Freq: Every day | ORAL | 3 refills | Status: DC

## 2022-02-16 ENCOUNTER — Telehealth: Payer: Self-pay

## 2022-02-16 NOTE — Telephone Encounter (Signed)
Patient called advising that she went to her pcp this morning and that she was diagnosed with a UTI. She is starting antibiotics today and her pcp advised her she may need to move this appt up. Patient just wanted to make you aware.   Should patient complete antibiotics before coming in for her follow up?

## 2022-03-02 ENCOUNTER — Ambulatory Visit (INDEPENDENT_AMBULATORY_CARE_PROVIDER_SITE_OTHER): Payer: Medicare Other | Admitting: Urology

## 2022-03-02 VITALS — BP 136/82 | HR 80

## 2022-03-02 DIAGNOSIS — R32 Unspecified urinary incontinence: Secondary | ICD-10-CM

## 2022-03-02 DIAGNOSIS — R339 Retention of urine, unspecified: Secondary | ICD-10-CM | POA: Diagnosis not present

## 2022-03-02 DIAGNOSIS — N390 Urinary tract infection, site not specified: Secondary | ICD-10-CM

## 2022-03-02 LAB — URINALYSIS, ROUTINE W REFLEX MICROSCOPIC
Bilirubin, UA: NEGATIVE
Glucose, UA: NEGATIVE
Ketones, UA: NEGATIVE
Nitrite, UA: POSITIVE — AB
Specific Gravity, UA: 1.01 (ref 1.005–1.030)
Urobilinogen, Ur: 0.2 mg/dL (ref 0.2–1.0)
pH, UA: 5.5 (ref 5.0–7.5)

## 2022-03-02 LAB — MICROSCOPIC EXAMINATION
Renal Epithel, UA: NONE SEEN /hpf
WBC, UA: >30 /hpf — AB (ref 0–5)

## 2022-03-02 LAB — BLADDER SCAN AMB NON-IMAGING: Scan Result: 172

## 2022-03-02 MED ORDER — DOXYCYCLINE HYCLATE 100 MG PO CAPS: 100.0000 mg | ORAL_CAPSULE | Freq: Two times a day (BID) | ORAL | 0 refills | Status: AC

## 2022-03-02 MED ORDER — BETHANECHOL CHLORIDE 5 MG PO TABS: 5.0000 mg | ORAL_TABLET | Freq: Two times a day (BID) | ORAL | 11 refills | Status: AC

## 2022-03-02 MED ORDER — TRIMETHOPRIM 100 MG PO TABS: 100.0000 mg | ORAL_TABLET | Freq: Every evening | ORAL | 11 refills | Status: AC

## 2022-03-02 NOTE — Progress Notes (Signed)
post void residual=172

## 2022-03-02 NOTE — Patient Instructions (Signed)

## 2022-03-02 NOTE — Progress Notes (Signed)
03/02/2022 3:02 PM   Jaime Weber September 14, 1940 [id number]  Referring provider: William Dalton, [place] [address]  Followup recurrent UTI and incomplete emptying    HPI: Jaime Weber is a 81yo here for followup for recurrent UTI and incomplete emptying. Uroxatral caused dizziness and she stopped the medication. PVR 172cc. She has gotten 2 UTIs since last while on macrodantin prophylaxis. NO worsening LUTS. NO dysuria or hematuria   PMH: Past Medical History:  Diagnosis Date   Atypical mole 09/10/2013   left anterior knee mild   Borderline glaucoma    Constipation    Lesion of bladder    PONV (postoperative nausea and vomiting)    Prolapse of female pelvic organs    Rectocele    Wears glasses     Surgical History: Past Surgical History:  Procedure Laterality Date   BREAST BIOPSY Right 04/13/2011   cataract bilateral   approx may 2019 then 2019,   COLONOSCOPY  2014   [place], negative per patient   CYSTOSCOPY N/A 03/15/2019   Procedure: CYSTOSCOPY;  Surgeon: Bjorn Loser, MD;  Location: [facility];  Service: Urology;  Laterality: N/A;   CYSTOSCOPY W/ RETROGRADES Bilateral 07/13/2015   Procedure: CYSTOSCOPY WITH BILATERAL RETROGRADE PYELOGRAM;  Surgeon: Cleon Gustin, MD;  Location: [facility];  Service: Urology;  Laterality: Bilateral;   CYSTOSCOPY WITH BIOPSY N/A 07/13/2015   Procedure: CYSTOSCOPY WITH BIOPSY WITH SELECTIVE CYTOLOGIES;  Surgeon: Cleon Gustin, MD;  Location: [facility];  Service: Urology;  Laterality: N/A;   ORIF RADIAL FRACTURE  07/05/2011   Procedure: OPEN REDUCTION INTERNAL FIXATION (ORIF) RADIAL FRACTURE;  Surgeon: Cammie Sickle., MD;  Location: [place];  Service: Orthopedics;  Laterality: Left;   PUBOVAGINAL SLING N/A 03/15/2019   Procedure: Gaynelle Arabian;  Surgeon: Bjorn Loser, MD;  Location: [facility];  Service: Urology;  Laterality: N/A;    ROBOTIC ASSISTED LAPAROSCOPIC SACROCOLPOPEXY N/A 03/15/2019   Procedure: ROBOTIC ASSISTED LAPAROSCOPIC SACROCOLPOPEXY;  Surgeon: Ardis Hughs, MD;  Location: [facility];  Service: Urology;  Laterality: N/A;   TONSILLECTOMY  as child   VAGINAL HYSTERECTOMY  1980's    Home Medications:  Allergies as of 03/02/2022       Reactions   Cephalosporins Rash   Penicillins Rash   Did it involve swelling of the face/tongue/throat, SOB, or low BP? No Did it involve sudden or severe rash/hives, skin peeling, or any reaction on the inside of your mouth or nose? no Did you need to seek medical attention at a hospital or doctor's office? No When did it last happen? Many years ago       If all above answers are "NO", may proceed with cephalosporin use.   Sulfa Antibiotics Rash   Hydrocodone Rash        Medication List        Accurate as of March 02, 2022  3:02 PM. If you have any questions, ask your nurse or doctor.          alfuzosin 10 MG 24 hr tablet Commonly known as: UROXATRAL Take 1 tablet (10 mg total) by mouth daily with breakfast.   doxycycline 100 MG capsule Commonly known as: VIBRAMYCIN Take 1 capsule (100 mg total) by mouth every 12 (twelve) hours.   escitalopram 20 MG tablet Commonly known as: LEXAPRO Take 20 mg by mouth daily.   MAGNESIUM PO Take by mouth daily. Magnesium Optimizer   MAXI-CALM PO  Take by mouth daily. Calm supplement for constipation   MULTI-ENZYME PO Take by mouth daily. Whole enzyme supplement   nitrofurantoin (macrocrystal-monohydrate) 100 MG capsule Commonly known as: MACROBID Take 1 capsule (100 mg total) by mouth 2 (two) times daily. Stop nightly macrodantin until 7 day nitrofurantoin is complete.   nitrofurantoin 50 MG capsule Commonly known as: MACRODANTIN Take 1 capsule (50 mg total) by mouth at bedtime.   ONE-A-DAY BONE STRENGTH PO Take by mouth daily.   solifenacin 10 MG tablet Commonly known as: VESICARE Take 1 tablet (10  mg total) by mouth daily.   solifenacin 10 MG tablet Commonly known as: VESICARE Take 1 tablet by mouth once daily   SUPER OMEGA 3 PO Take by mouth daily.   triamcinolone cream 0.1 % Commonly known as: KENALOG Apply 1 Application topically 2 (two) times daily as needed.   ULTRAFLORA IMMUNE HEALTH PO Take by mouth daily. Ultra-Flora Balance supplement   vitamin C 1000 MG tablet Take 1,000 mg by mouth daily.   VITAMIN D-3 PO Take by mouth daily.        Allergies:  Allergies  Allergen Reactions   Cephalosporins Rash   Penicillins Rash    Did it involve swelling of the face/tongue/throat, SOB, or low BP? No Did it involve sudden or severe rash/hives, skin peeling, or any reaction on the inside of your mouth or nose? no Did you need to seek medical attention at a hospital or doctor's office? No When did it last happen? Many years ago       If all above answers are "NO", may proceed with cephalosporin use.    Sulfa Antibiotics Rash   Hydrocodone Rash    Family History: Family History  Problem Relation Age of Onset   Cancer Father        ORAL    Heart attack Son        deceased   Breast cancer Neg Hx     Social History:  reports that she has never smoked. She has never used smokeless tobacco. She reports that she does not currently use alcohol. She reports that she does not use drugs.  ROS: All other review of systems were reviewed and are negative except what is noted above in HPI  Physical Exam: BP 136/82   Pulse 80   Constitutional:  Alert and oriented, No acute distress. HEENT: [place] AT, moist mucus membranes.  Trachea midline, no masses. Cardiovascular: No clubbing, cyanosis, or edema. Respiratory: Normal respiratory effort, no increased work of breathing. GI: Abdomen is soft, nontender, nondistended, no abdominal masses GU: No CVA tenderness.  Lymph: No cervical or inguinal lymphadenopathy. Skin: No rashes, bruises or suspicious lesions. Neurologic:  Grossly intact, no focal deficits, moving all 4 extremities. Psychiatric: Normal mood and affect.  Laboratory Data: Lab Results  Component Value Date   WBC 9.1 03/12/2019   HGB 11.4 (L) 03/16/2019   HCT 35.8 (L) 03/16/2019   MCV 96.2 03/12/2019   PLT 247 03/12/2019    Lab Results  Component Value Date   CREATININE 0.68 03/16/2019    No results found for: "PSA"  No results found for: "TESTOSTERONE"  No results found for: "HGBA1C"  Urinalysis    Component Value Date/Time   APPEARANCEUR Clear 01/28/2022 1110   GLUCOSEU Negative 01/28/2022 1110   BILIRUBINUR Negative 01/28/2022 1110   PROTEINUR Negative 01/28/2022 1110   NITRITE Negative 01/28/2022 1110   LEUKOCYTESUR 1+ (A) 01/28/2022 1110    Lab Results  Component Value  Date   LABMICR See below: 01/28/2022   WBCUA 6-10 (A) 01/28/2022   LABEPIT 0-10 01/28/2022   BACTERIA Few (A) 01/28/2022    Pertinent Imaging:  No results found for this or any previous visit.  No results found for this or any previous visit.  No results found for this or any previous visit.  No results found for this or any previous visit.  No results found for this or any previous visit.  No results found for this or any previous visit.  No results found for this or any previous visit.  No results found for this or any previous visit.   Assessment & Plan:    1. Recurrent UTI -urine for culture -doxycycline '100mg'$  BID for 7 days -start trimethoprim prophylaxis  - Urinalysis, Routine w reflex microscopic - BLADDER SCAN AMB NON-IMAGING - Urine Culture  2. Incomplete emptying -We will trial bethenacol '5mg'$  BID   No follow-ups on file.  Nicolette Bang, MD  [facility] Urology [place]

## 2022-03-03 NOTE — Telephone Encounter (Signed)
Patient seen MD in office 03/02/2022

## 2022-03-04 ENCOUNTER — Ambulatory Visit: Payer: PRIVATE HEALTH INSURANCE | Admitting: Urology

## 2022-03-05 LAB — URINE CULTURE

## 2022-03-08 ENCOUNTER — Telehealth: Payer: Self-pay

## 2022-03-08 NOTE — Telephone Encounter (Signed)
Patient left a voice message 03-08-2022.  Since taking new Rx her blood pressure has dropped to 107-111.  She didn't take it last night and her blood pressure increased to 125.  Requesting a call back from [place].  Please advise.  Helene Kelp

## 2022-03-08 NOTE — Telephone Encounter (Signed)
Return patient call and made her aware that her blood pressure was in the normal range. Patient voiced she would like to talk to [place]. Made patient aware that Estill Bamberg will call her back.

## 2022-03-08 NOTE — Telephone Encounter (Signed)
Returned call to patient.  Patient reports after starting bethanechol her BP dropped to 105/70s' and reports dizziness and fatigue. Patient held medication and felt better today.  Reports she is unable to continue medication and would like advice on how to proceed next.

## 2022-03-15 ENCOUNTER — Encounter: Payer: Self-pay | Admitting: Urology

## 2022-03-17 NOTE — Telephone Encounter (Signed)
See below, please advise.

## 2022-03-23 ENCOUNTER — Telehealth: Payer: Self-pay

## 2022-03-23 NOTE — Telephone Encounter (Signed)
Patient left a voice message for a call back.  Call back [phone]  Thanks, Helene Kelp

## 2022-03-23 NOTE — Telephone Encounter (Signed)
Return patient call. Patient voiced she need to change her appt with Dr. Alyson Ingles. Made patient aware that Dr. Alyson Ingles need appt is 04/08/22, patient voiced she would like her appt to be change to 10/13. Scheduled patient appt to 10/13. Patient voiced understanding.

## 2022-03-28 ENCOUNTER — Telehealth: Payer: Self-pay

## 2022-03-30 ENCOUNTER — Ambulatory Visit: Payer: PRIVATE HEALTH INSURANCE | Admitting: Urology

## 2022-03-30 ENCOUNTER — Ambulatory Visit (INDEPENDENT_AMBULATORY_CARE_PROVIDER_SITE_OTHER): Payer: Medicare Other | Admitting: Urology

## 2022-03-30 VITALS — BP 134/71 | HR 65

## 2022-03-30 DIAGNOSIS — Z87440 Personal history of urinary (tract) infections: Secondary | ICD-10-CM | POA: Diagnosis not present

## 2022-03-30 DIAGNOSIS — R339 Retention of urine, unspecified: Secondary | ICD-10-CM

## 2022-03-30 DIAGNOSIS — R32 Unspecified urinary incontinence: Secondary | ICD-10-CM

## 2022-03-30 DIAGNOSIS — N390 Urinary tract infection, site not specified: Secondary | ICD-10-CM

## 2022-03-30 LAB — URINALYSIS, ROUTINE W REFLEX MICROSCOPIC
Bilirubin, UA: NEGATIVE
Glucose, UA: NEGATIVE
Ketones, UA: NEGATIVE
Nitrite, UA: NEGATIVE
Protein,UA: NEGATIVE
Specific Gravity, UA: 1.01 (ref 1.005–1.030)
Urobilinogen, Ur: 0.2 mg/dL (ref 0.2–1.0)
pH, UA: 7 (ref 5.0–7.5)

## 2022-03-30 LAB — BLADDER SCAN AMB NON-IMAGING: Scan Result: 278

## 2022-03-30 LAB — MICROSCOPIC EXAMINATION: Bacteria, UA: NONE SEEN

## 2022-03-30 NOTE — Progress Notes (Signed)
03/30/2022 9:22 AM   Jaime Weber April 04, 1941 [id number]  Referring provider: William Dalton, [place] [address]  Followup incomplete emptying   HPI: Jaime Weber is a 81yo here for followup for incomplete emptying, urge incontinence and recurrent UTI. PVR 278cc. No UTIs since last visit. No dysuria. She uses 7-8 pads per day. She has straining to urinate. She has urinary frequency every hour. She has tried and could not tolerate alpha blockers.    PMH: Past Medical History:  Diagnosis Date   Atypical mole 09/10/2013   left anterior knee mild   Borderline glaucoma    Constipation    Lesion of bladder    PONV (postoperative nausea and vomiting)    Prolapse of female pelvic organs    Rectocele    Wears glasses     Surgical History: Past Surgical History:  Procedure Laterality Date   BREAST BIOPSY Right 04/13/2011   cataract bilateral   approx may 2019 then 2019,   COLONOSCOPY  2014   [place], negative per patient   CYSTOSCOPY N/A 03/15/2019   Procedure: CYSTOSCOPY;  Surgeon: Bjorn Loser, MD;  Location: [facility];  Service: Urology;  Laterality: N/A;   CYSTOSCOPY W/ RETROGRADES Bilateral 07/13/2015   Procedure: CYSTOSCOPY WITH BILATERAL RETROGRADE PYELOGRAM;  Surgeon: Cleon Gustin, MD;  Location: [facility];  Service: Urology;  Laterality: Bilateral;   CYSTOSCOPY WITH BIOPSY N/A 07/13/2015   Procedure: CYSTOSCOPY WITH BIOPSY WITH SELECTIVE CYTOLOGIES;  Surgeon: Cleon Gustin, MD;  Location: [facility];  Service: Urology;  Laterality: N/A;   ORIF RADIAL FRACTURE  07/05/2011   Procedure: OPEN REDUCTION INTERNAL FIXATION (ORIF) RADIAL FRACTURE;  Surgeon: Cammie Sickle., MD;  Location: [place];  Service: Orthopedics;  Laterality: Left;   PUBOVAGINAL SLING N/A 03/15/2019   Procedure: Gaynelle Arabian;  Surgeon: Bjorn Loser, MD;  Location: [facility];  Service: Urology;   Laterality: N/A;   ROBOTIC ASSISTED LAPAROSCOPIC SACROCOLPOPEXY N/A 03/15/2019   Procedure: ROBOTIC ASSISTED LAPAROSCOPIC SACROCOLPOPEXY;  Surgeon: Ardis Hughs, MD;  Location: [facility];  Service: Urology;  Laterality: N/A;   TONSILLECTOMY  as child   VAGINAL HYSTERECTOMY  1980's    Home Medications:  Allergies as of 03/30/2022       Reactions   Cephalosporins Rash   Penicillins Rash   Did it involve swelling of the face/tongue/throat, SOB, or low BP? No Did it involve sudden or severe rash/hives, skin peeling, or any reaction on the inside of your mouth or nose? no Did you need to seek medical attention at a hospital or doctor's office? No When did it last happen? Many years ago       If all above answers are "NO", may proceed with cephalosporin use.   Sulfa Antibiotics Rash   Hydrocodone Rash        Medication List        Accurate as of March 30, 2022  9:22 AM. If you have any questions, ask your nurse or doctor.          bethanechol 5 MG tablet Commonly known as: URECHOLINE Take 1 tablet (5 mg total) by mouth 2 (two) times daily.   doxycycline 100 MG capsule Commonly known as: VIBRAMYCIN Take 1 capsule (100 mg total) by mouth every 12 (twelve) hours.   doxycycline 100 MG capsule Commonly known as: VIBRAMYCIN Take 1 capsule (100 mg total) by mouth every 12 (twelve) hours.   escitalopram  20 MG tablet Commonly known as: LEXAPRO Take 20 mg by mouth daily.   MAGNESIUM PO Take by mouth daily. Magnesium Optimizer   MAXI-CALM PO Take by mouth daily. Calm supplement for constipation   MULTI-ENZYME PO Take by mouth daily. Whole enzyme supplement   nitrofurantoin (macrocrystal-monohydrate) 100 MG capsule Commonly known as: MACROBID Take 1 capsule (100 mg total) by mouth 2 (two) times daily. Stop nightly macrodantin until 7 day nitrofurantoin is complete.   nitrofurantoin 50 MG capsule Commonly known as: MACRODANTIN Take 1 capsule (50 mg total) by mouth at  bedtime.   ONE-A-DAY BONE STRENGTH PO Take by mouth daily.   solifenacin 10 MG tablet Commonly known as: VESICARE Take 1 tablet (10 mg total) by mouth daily.   solifenacin 10 MG tablet Commonly known as: VESICARE Take 1 tablet by mouth once daily   SUPER OMEGA 3 PO Take by mouth daily.   triamcinolone cream 0.1 % Commonly known as: KENALOG Apply 1 Application topically 2 (two) times daily as needed.   trimethoprim 100 MG tablet Commonly known as: TRIMPEX Take 1 tablet (100 mg total) by mouth at bedtime.   ULTRAFLORA IMMUNE HEALTH PO Take by mouth daily. Ultra-Flora Balance supplement   vitamin C 1000 MG tablet Take 1,000 mg by mouth daily.   VITAMIN D-3 PO Take by mouth daily.        Allergies:  Allergies  Allergen Reactions   Cephalosporins Rash   Penicillins Rash    Did it involve swelling of the face/tongue/throat, SOB, or low BP? No Did it involve sudden or severe rash/hives, skin peeling, or any reaction on the inside of your mouth or nose? no Did you need to seek medical attention at a hospital or doctor's office? No When did it last happen? Many years ago       If all above answers are "NO", may proceed with cephalosporin use.    Sulfa Antibiotics Rash   Hydrocodone Rash    Family History: Family History  Problem Relation Age of Onset   Cancer Father        ORAL    Heart attack Son        deceased   Breast cancer Neg Hx     Social History:  reports that she has never smoked. She has never used smokeless tobacco. She reports that she does not currently use alcohol. She reports that she does not use drugs.  ROS: All other review of systems were reviewed and are negative except what is noted above in HPI  Physical Exam: BP 134/71   Pulse 65   Constitutional:  Alert and oriented, No acute distress. HEENT:  AT, moist mucus membranes.  Trachea midline, no masses. Cardiovascular: No clubbing, cyanosis, or edema. Respiratory: Normal  respiratory effort, no increased work of breathing. GI: Abdomen is soft, nontender, nondistended, no abdominal masses GU: No CVA tenderness.  Lymph: No cervical or inguinal lymphadenopathy. Skin: No rashes, bruises or suspicious lesions. Neurologic: Grossly intact, no focal deficits, moving all 4 extremities. Psychiatric: Normal mood and affect.  Laboratory Data: Lab Results  Component Value Date   WBC 9.1 03/12/2019   HGB 11.4 (L) 03/16/2019   HCT 35.8 (L) 03/16/2019   MCV 96.2 03/12/2019   PLT 247 03/12/2019    Lab Results  Component Value Date   CREATININE 0.68 03/16/2019    No results found for: "PSA"  No results found for: "TESTOSTERONE"  No results found for: "HGBA1C"  Urinalysis    Component  Value Date/Time   APPEARANCEUR Cloudy (A) 03/02/2022 1429   GLUCOSEU Negative 03/02/2022 1429   BILIRUBINUR Negative 03/02/2022 1429   PROTEINUR Trace (A) 03/02/2022 1429   NITRITE Positive (A) 03/02/2022 1429   LEUKOCYTESUR 3+ (A) 03/02/2022 1429    Lab Results  Component Value Date   LABMICR See below: 03/02/2022   WBCUA >30 (A) 03/02/2022   LABEPIT 0-10 03/02/2022   MUCUS Present 03/02/2022   BACTERIA Many (A) 03/02/2022    Pertinent Imaging:  No results found for this or any previous visit.  No results found for this or any previous visit.  No results found for this or any previous visit.  No results found for this or any previous visit.  No results found for this or any previous visit.  No valid procedures specified. No results found for this or any previous visit.  No results found for this or any previous visit.   Assessment & Plan:    1. Recurrent UTI -continue trimethoprim '100mg'$  qhs - Urinalysis, Routine w reflex microscopic - BLADDER SCAN AMB NON-IMAGING  2. Urge incontinence -schedule for urodynamics  3. Incomplete emptying -Schedule for urodynamics  No follow-ups on file.  Nicolette Bang, MD  [facility] [place]

## 2022-03-30 NOTE — Telephone Encounter (Signed)
Medication not sent in due to blood pressure issues. Patient will f/u with MD on 10/4

## 2022-03-30 NOTE — Progress Notes (Signed)
post void residual= 278

## 2022-03-30 NOTE — Telephone Encounter (Signed)
Medication not sent in due to blood pressure issues.  Patient will see MD 10/4

## 2022-04-01 LAB — URINE CULTURE

## 2022-04-07 ENCOUNTER — Encounter: Payer: Self-pay | Admitting: Urology

## 2022-04-07 NOTE — Patient Instructions (Signed)
Acute Urinary Retention, Female  Acute urinary retention is when a person cannot pee (urinate) at all, or can only pee a little. This can come on all of a sudden. If it is not treated, it can lead to kidney problems or other serious problems. What are the causes? A problem with the tube that drains the bladder (urethra). Problems with the nerves in the bladder. The organs in the area between your hip bones (pelvis) slipping out of place (prolapse). Tumors. The birth of a baby through the vagina. An infection. Having trouble pooping (constipation). Certain medicines. What increases the risk? Women over age 50 are more at risk. Other conditions also can increase risk. These include: Diseases, such as multiple sclerosis. Injury to the spinal cord. Diabetes. A condition that affects the way the brain works, such as dementia. Holding back urine due to trauma or because you do not want to use the bathroom. History of not being able to pee or peeing too little. Having had surgery in the area between your hip bones. What are the signs or symptoms? Trouble peeing. Pain in the lower belly. How is this treated? Treatment for this condition may include: Medicines. Placing a thin, germ-free tube (catheter) into the bladder to drain pee out of the body. Therapy to treat mental health conditions. Treatment for conditions that may cause this. If needed, you may be treated in the hospital for kidney problems or to manage other problems. Follow these instructions at home: Medicines Take over-the-counter and prescription medicines only as told by your doctor. Ask your doctor what medicines you should stay away from. If you were given an antibiotic medicine, take it as told by your doctor. Do not stop taking it even if you start to feel better. General instructions Do not smoke or use any products that contain nicotine or tobacco. If you need help quitting, ask your doctor. Drink enough fluid to  keep your pee pale yellow. If you were sent home with a tube that drains the bladder, take care of it as told by your doctor. Watch for changes in your symptoms. Tell your doctor about them. If told, keep track of changes in your blood pressure at home. Tell your doctor about them. Keep all follow-up visits. Contact a doctor if: You have spasms in your bladder that you cannot stop. You leak pee when you have spasms. Get help right away if: You have chills or a fever. You have blood in your pee. You have a tube that drains pee from the bladder and these things happen: The tube stops draining pee. The tube falls out. Summary Acute urinary retention is when you cannot pee at all or you pee too little. If this is not treated, it can cause kidney problems or other serious problems. If you were sent home with a tube (catheter) that drains pee from the bladder, take care of it as told by your doctor. Watch for changes in your symptoms. Tell your doctor about them. This information is not intended to replace advice given to you by your health care provider. Make sure you discuss any questions you have with your health care provider. Document Revised: 03/04/2020 Document Reviewed: 03/04/2020 Elsevier Patient Education  2023 Elsevier Inc.  

## 2022-04-08 ENCOUNTER — Ambulatory Visit: Payer: PRIVATE HEALTH INSURANCE | Admitting: Urology

## 2022-04-20 ENCOUNTER — Ambulatory Visit: Payer: PRIVATE HEALTH INSURANCE | Admitting: Urology

## 2022-07-19 ENCOUNTER — Other Ambulatory Visit: Payer: Self-pay | Admitting: Urology

## 2022-07-22 ENCOUNTER — Other Ambulatory Visit: Payer: Self-pay

## 2022-07-22 MED ORDER — SOLIFENACIN SUCCINATE 10 MG PO TABS: 10.0000 mg | ORAL_TABLET | Freq: Every day | ORAL | 3 refills | Status: AC

## 2022-09-04 ENCOUNTER — Other Ambulatory Visit: Payer: Self-pay

## 2022-09-04 ENCOUNTER — Emergency Department (HOSPITAL_BASED_OUTPATIENT_CLINIC_OR_DEPARTMENT_OTHER): Payer: Medicare Other

## 2022-09-04 ENCOUNTER — Emergency Department (HOSPITAL_BASED_OUTPATIENT_CLINIC_OR_DEPARTMENT_OTHER)
Admission: EM | Admit: 2022-09-04 | Discharge: 2022-09-04 | Disposition: A | Discharge status: Home / Self Care | Payer: Medicare Other | Attending: Emergency Medicine | Admitting: Emergency Medicine

## 2022-09-04 ENCOUNTER — Encounter (HOSPITAL_BASED_OUTPATIENT_CLINIC_OR_DEPARTMENT_OTHER): Payer: Self-pay | Admitting: Emergency Medicine

## 2022-09-04 DIAGNOSIS — M545 Low back pain, unspecified: Secondary | ICD-10-CM

## 2022-09-04 DIAGNOSIS — R21 Rash and other nonspecific skin eruption: Secondary | ICD-10-CM | POA: Insufficient documentation

## 2022-09-04 DIAGNOSIS — N8110 Cystocele, unspecified: Secondary | ICD-10-CM | POA: Insufficient documentation

## 2022-09-04 DIAGNOSIS — R109 Unspecified abdominal pain: Secondary | ICD-10-CM

## 2022-09-04 DIAGNOSIS — R1030 Lower abdominal pain, unspecified: Secondary | ICD-10-CM | POA: Diagnosis present

## 2022-09-04 LAB — URINALYSIS, ROUTINE W REFLEX MICROSCOPIC
Bacteria, UA: NONE SEEN
Bilirubin Urine: NEGATIVE
Glucose, UA: NEGATIVE mg/dL
Ketones, ur: NEGATIVE mg/dL
Nitrite: NEGATIVE
Protein, ur: NEGATIVE mg/dL
Specific Gravity, Urine: <1.005 — ABNORMAL LOW (ref 1.005–1.030)
pH: 7.5 (ref 5.0–8.0)

## 2022-09-04 LAB — COMPREHENSIVE METABOLIC PANEL
ALT: 14 U/L (ref 0–44)
AST: 13 U/L — ABNORMAL LOW (ref 15–41)
Albumin: 3.8 g/dL (ref 3.5–5.0)
Alkaline Phosphatase: 78 U/L (ref 38–126)
Anion gap: 9 (ref 5–15)
BUN: 12 mg/dL (ref 8–23)
CO2: 27 mmol/L (ref 22–32)
Calcium: 10 mg/dL (ref 8.9–10.3)
Chloride: 97 mmol/L — ABNORMAL LOW (ref 98–111)
Creatinine, Ser: 0.83 mg/dL (ref 0.44–1.00)
GFR, Estimated: >60 mL/min (ref >60)
Glucose, Bld: 107 mg/dL — ABNORMAL HIGH (ref 70–99)
Potassium: 3.7 mmol/L (ref 3.5–5.1)
Sodium: 133 mmol/L — ABNORMAL LOW (ref 135–145)
Total Bilirubin: 0.7 mg/dL (ref 0.3–1.2)
Total Protein: 7.1 g/dL (ref 6.5–8.1)

## 2022-09-04 LAB — CBC WITH DIFFERENTIAL/PLATELET
Abs Immature Granulocytes: 0.04 10*3/uL (ref 0.00–0.07)
Basophils Absolute: 0.1 10*3/uL (ref 0.0–0.1)
Basophils Relative: 1 %
Eosinophils Absolute: 0.1 10*3/uL (ref 0.0–0.5)
Eosinophils Relative: 1 %
HCT: 40.4 % (ref 36.0–46.0)
Hemoglobin: 13.8 g/dL (ref 12.0–15.0)
Immature Granulocytes: 1 %
Lymphocytes Relative: 21 %
Lymphs Abs: 1.6 10*3/uL (ref 0.7–4.0)
MCH: 31.4 pg (ref 26.0–34.0)
MCHC: 34.2 g/dL (ref 30.0–36.0)
MCV: 92 fL (ref 80.0–100.0)
Monocytes Absolute: 0.7 10*3/uL (ref 0.1–1.0)
Monocytes Relative: 9 %
Neutro Abs: 5.2 10*3/uL (ref 1.7–7.7)
Neutrophils Relative %: 67 %
Platelets: 228 10*3/uL (ref 150–400)
RBC: 4.39 MIL/uL (ref 3.87–5.11)
RDW: 12.3 % (ref 11.5–15.5)
WBC: 7.7 10*3/uL (ref 4.0–10.5)
nRBC: 0 % (ref 0.0–0.2)

## 2022-09-04 LAB — LACTIC ACID, PLASMA: Lactic Acid, Venous: 1 mmol/L (ref 0.5–1.9)

## 2022-09-04 LAB — LIPASE, BLOOD: Lipase: 20 U/L (ref 11–51)

## 2022-09-04 MED ORDER — METHOCARBAMOL 500 MG PO TABS: 500.0000 mg | ORAL_TABLET | Freq: Two times a day (BID) | ORAL | 0 refills | Status: AC

## 2022-09-04 MED ORDER — METHOCARBAMOL 500 MG PO TABS
500.0000 mg | ORAL_TABLET | Freq: Once | ORAL | Status: AC
  Administered 2022-09-04: 500 mg via ORAL
  Filled 2022-09-04: qty 1

## 2022-09-04 MED ORDER — IOHEXOL 300 MG/ML  SOLN
100.0000 mL | Freq: Once | INTRAMUSCULAR | Status: AC | PRN
  Administered 2022-09-04: 80 mL via INTRAVENOUS

## 2022-09-04 MED ORDER — LACTATED RINGERS IV BOLUS
1000.0000 mL | Freq: Once | INTRAVENOUS | Status: AC
  Administered 2022-09-04: 1000 mL via INTRAVENOUS

## 2022-09-04 NOTE — ED Triage Notes (Signed)
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 Dx UTI , on Sunday sent home on antibiotics. Tuesday night more pain and abdomen is swollen (pt was impacted).tried soap suds enema, nothing. Pt hasn't had BM, excrutiating pain, "everything is falling out of her vagina, and she has to push her rectum up when she tries to have BM>

## 2022-09-04 NOTE — ED Provider Notes (Signed)
[facility] Provider Note   CSN: [id number] Arrival date & time: 09/04/22  1620     History  Chief Complaint  Patient presents with   Vaginal Prolapse    Jaime Weber is a 82 y.o. female.  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  Last week patient went to an ER closer to her home because she was having constipation, abdominal pain and some urinary symptoms.  At that visit last Sunday she was found to have a UTI, started on nitrofurantoin, had a CT that showed no evidence of renal stones but did show constipation.  She left that visit with the antibiotic but reports over the next 3 days despite taking the antibiotic her stomach started becoming extremely distended, weight gain and inability to have a bowel movement.  On Tuesday she went back to the same outside emergency room and at that time had a plain film that showed that she was still constipated.  She had 1 episode of vomiting that day but she was given GoLytely to take at home.  Prior she had tried magnesium citrate and a stool softener.  After taking GoLytely patient reports that she did poop a lot and lost 6 to 7 pounds.  However since that time she has had persistent lower abdominal pain and now reports a left side pain as well.  She has not had fever but has been intermittently nauseated no further vomiting and is have a poor appetite.  She finished her last antibiotic today denies any urinary symptoms at this time.  Her daughter brought her here today because she has been miserable and is still having a lot of pain.  She has had no falls and has not noticed any rashes.        Home Medications Prior to Admission medications   Medication Sig Start Date End Date Taking? Authorizing Provider  Ascorbic  Acid (VITAMIN C) 1000 MG tablet Take 1,000 mg by mouth daily.    [provider]  bethanechol (URECHOLINE) 5 MG tablet Take 1 tablet (5 mg total) by mouth 2 (two) times daily. 03/02/22   McKenzie, Candee Furbish, MD  Cholecalciferol (VITAMIN D-3 PO) Take by mouth daily.    [provider]  Digestive Enzymes (MULTI-ENZYME PO) Take by mouth daily. Whole enzyme supplement    [provider]  doxycycline (VIBRAMYCIN) 100 MG capsule Take 1 capsule (100 mg total) by mouth every 12 (twelve) hours. 01/12/22   McKenzie, Candee Furbish, MD  doxycycline (VIBRAMYCIN) 100 MG capsule Take 1 capsule (100 mg total) by mouth every 12 (twelve) hours. 03/02/22   McKenzie, Candee Furbish, MD  escitalopram (LEXAPRO) 20 MG tablet Take 20 mg by mouth daily. 11/26/21   [provider]  MAGNESIUM PO Take by mouth daily. Magnesium Optimizer    [provider]  nitrofurantoin (MACRODANTIN) 50 MG capsule Take 1 capsule (50 mg total) by mouth at bedtime. 05/26/21   McKenzie, Candee Furbish, MD  nitrofurantoin, macrocrystal-monohydrate, (MACROBID) 100 MG capsule Take 1 capsule (100 mg total) by mouth 2 (two) times daily. Stop nightly macrodantin until 7 day nitrofurantoin is complete. 08/26/21   McKenzie, Candee Furbish, MD  Nutritional Supplements (MAXI-CALM PO) Take by mouth daily. Calm supplement for constipation    [provider]  Omega-3 Fatty Acids (  SUPER OMEGA 3 PO) Take by mouth daily.    [provider]  Probiotic Product ([place]) Take by mouth daily. Ultra-Flora Balance supplement    [provider]  solifenacin (VESICARE) 10 MG tablet Take 1 tablet by mouth once daily 07/25/22   McKenzie, Candee Furbish, MD  solifenacin (VESICARE) 10 MG tablet Take 1 tablet (10 mg total) by mouth daily. 07/22/22   McKenzie, Candee Furbish, MD  Specialty Vitamins Products (ONE-A-DAY BONE STRENGTH PO) Take by mouth daily.    [provider]  triamcinolone cream (KENALOG) 0.1 %  Apply 1 Application topically 2 (two) times daily as needed. 01/05/22   Sheffield, Ronalee Red, PA-C  trimethoprim (TRIMPEX) 100 MG tablet Take 1 tablet (100 mg total) by mouth at bedtime. 03/02/22   McKenzie, Candee Furbish, MD      Allergies    Cephalosporins, Penicillins, Sulfa antibiotics, Septra [sulfamethoxazole-trimethoprim], and Hydrocodone    Review of Systems   Review of Systems  Physical Exam Updated Vital Signs BP (!) 156/80   Pulse 65   Temp 98.1 F (36.7 C) (Oral)   Resp 18   SpO2 100%  Physical Exam Vitals and nursing note reviewed.  Constitutional:      General: She is not in acute distress.    Appearance: She is well-developed. She is ill-appearing.  HENT:     Head: Normocephalic and atraumatic.  Eyes:     Pupils: Pupils are equal, round, and reactive to light.  Cardiovascular:     Rate and Rhythm: Normal rate and regular rhythm.     Heart sounds: Normal heart sounds. No murmur heard.    No friction rub.  Pulmonary:     Effort: Pulmonary effort is normal.     Breath sounds: Normal breath sounds. No wheezing or rales.  Abdominal:     General: Bowel sounds are normal. There is no distension.     Palpations: Abdomen is soft.     Tenderness: There is abdominal tenderness in the right lower quadrant, suprapubic area and left lower quadrant. There is no guarding or rebound.  Genitourinary:    Comments: Bladder prolapse which is easily reducible.  No rectal prolapse or fecal impaction Musculoskeletal:        General: No tenderness. Normal range of motion.     Comments: No edema  Skin:    General: Skin is warm and dry.     Findings: Rash present.       Neurological:     Mental Status: She is alert and oriented to person, place, and time. Mental status is at baseline.     Cranial Nerves: No cranial nerve deficit.  Psychiatric:        Behavior: Behavior normal.     ED Results / Procedures / Treatments   Labs (all labs ordered are listed, but only abnormal results  are displayed) Labs Reviewed  COMPREHENSIVE METABOLIC PANEL - Abnormal; Notable for the following components:      Result Value   Sodium 133 (*)    Chloride 97 (*)    Glucose, Bld 107 (*)    AST 13 (*)    All other components within normal limits  URINALYSIS, ROUTINE W REFLEX MICROSCOPIC - Abnormal; Notable for the following components:   Color, Urine COLORLESS (*)    Specific Gravity, Urine <1.005 (*)    Hgb urine dipstick MODERATE (*)    Leukocytes,Ua TRACE (*)    All other components within normal limits  CBC WITH  DIFFERENTIAL/PLATELET  LIPASE, BLOOD  LACTIC ACID, PLASMA  LACTIC ACID, PLASMA    EKG None  Radiology CT ABDOMEN PELVIS W CONTRAST  Result Date: 09/04/2022 CLINICAL DATA:  Left lower quadrant abdominal pain EXAM: CT ABDOMEN AND PELVIS WITH CONTRAST TECHNIQUE: Multidetector CT imaging of the abdomen and pelvis was performed using the standard protocol following bolus administration of intravenous contrast. RADIATION DOSE REDUCTION: This exam was performed according to the departmental dose-optimization program which includes automated exposure control, adjustment of the mA and/or kV according to patient size and/or use of iterative reconstruction technique. CONTRAST:  41m OMNIPAQUE IOHEXOL 300 MG/ML  SOLN COMPARISON:  CT abdomen and pelvis 02/28/2014 FINDINGS: Lower chest: There are trace bilateral pleural effusions. There is atelectasis in the lung bases. Hepatobiliary: No focal liver abnormality is seen. No gallstones, gallbladder wall thickening, or biliary dilatation. Pancreas: Unremarkable. No pancreatic ductal dilatation or surrounding inflammatory changes. Spleen: Normal in size without focal abnormality. Adrenals/Urinary Tract: Adrenal glands are unremarkable. Kidneys are normal, without renal calculi, focal lesion, or hydronephrosis. Bladder is unremarkable. Stomach/Bowel: There is a large hiatal hernia containing the stomach. The stomach is decompressed and gastric  wall thickening is not excluded. Hernia is new from prior. No evidence of bowel wall thickening, distention, or inflammatory changes. The appendix is not seen. There is sigmoid colon diverticulosis. Vascular/Lymphatic: Aortic atherosclerosis. No enlarged abdominal or pelvic lymph nodes. Reproductive: Status post hysterectomy. No adnexal masses. Other: There is pelvic floor relaxation. No ascites or focal hernia identified. Musculoskeletal: Bones are diffusely osteopenic. Vertebroplasty changes are seen L1. There are compression deformities of T12, L2, L3, L4 and L5 which are age indeterminate. IMPRESSION: 1. Large hiatal hernia containing the stomach. The stomach is decompressed and gastric wall thickening is not excluded. 2. Sigmoid colon diverticulosis. 3. Trace bilateral pleural effusions. 4. Diffuse osteopenia with compression deformities of T12, L2, L3, L4 and L5 which are age indeterminate. Correlate clinically. Aortic Atherosclerosis (ICD10-I70.0). Electronically Signed   By: ARonney AstersM.D.   On: 09/04/2022 18:59    Procedures Procedures    Medications Ordered in ED Medications  lactated ringers bolus 1,000 mL ( Intravenous Stopped 09/04/22 1900)  methocarbamol (ROBAXIN) tablet 500 mg (500 mg Oral Given 09/04/22 1731)  iohexol (OMNIPAQUE) 300 MG/ML solution 100 mL (80 mLs Intravenous Contrast Given 09/04/22 1827)    ED Course/ Medical Decision Making/ A&P                             Medical Decision Making Amount and/or Complexity of Data Reviewed External Data Reviewed: notes. Labs: ordered. Decision-making details documented in ED Course. Radiology: ordered and independent interpretation performed. Decision-making details documented in ED Course.  Risk Prescription drug management.   Pt with multiple medical problems and comorbidities and presenting today with a complaint that caries a high risk for morbidity and mortality.  Here today with persistent abdominal pain now also  having left-sided back pain.  Patient recently treated for UTI with nitrofurantoin but symptoms or not improving.  Also having issues with constipation and abdominal distention.  Patient did have a CT scan 1 week ago that showed no evidence of kidney stones but did show constipation.  However after doing a GoLytely cleanout patient has not improved.  On exam there is concern for possible shingles as patient has a small area of papules on the left side of her skin but it is not noted anywhere else in a specific dermatomal pattern  and is not vesicular at this time.  However still concerned.  Also concern for resistant UTI versus diverticulitis versus obstruction.  Patient given IV fluids.  She reports improvement after a muscle relaxer last week we will give that as well.  She does not want any pain medications at this time.  8:10 PM I independently interpreted patient's labs and CBC, CMP, lipase, lactate are all within normal limits.  Urine has some minimal blood but no evidence of obvious infection.  I have independently visualized and interpreted pt's images today.  Abdominal CT today without evidence of diverticulitis or obstruction.  Radiology reports that patient does have a large hiatal hernia containing stomach contents but patient is not having upper abdominal pain and there is all lower.  She has sigmoid diverticulosis but no evidence of diverticulitis at this time.  She does have compression deformities at T12-L5 patient reports she did have an injury months ago but has not had any recent falls.  However some of her pain is reproduced with certain movements.  Question whether some of the pain may be related that versus zoster. Patient's symptoms are improved after Robaxin.  Will give information for neurosurgery and orthospine in this area.  At this time no indication for admission or further testing.        Final Clinical Impression(s) / ED Diagnoses Final diagnoses:  None    Rx / DC  Orders ED Discharge Orders     None         Blanchie Dessert, MD 09/04/22 2010

## 2022-09-04 NOTE — Discharge Instructions (Signed)
All the blood work today looked good no sign of urinary tract infection.  Your CAT scan showed the hiatal hernia which you know about but no signs of constipation today and multiple compression fractures in your back.  Concerned that some of the pain you are having is related to your back.  You can use the muscle relaxer as needed.  You may want to take it before you go to bed at night but you should not drive with it.  Also get on and regime so you have normal bowel movements.  Whether it is taking magnesium or trying something like MiraLAX or stool softener.  Continue with your physical therapy.

## 2022-12-15 ENCOUNTER — Other Ambulatory Visit: Payer: Self-pay | Admitting: Neurosurgery

## 2022-12-15 DIAGNOSIS — M8000XA Age-related osteoporosis with current pathological fracture, unspecified site, initial encounter for fracture: Secondary | ICD-10-CM

## 2023-01-11 ENCOUNTER — Ambulatory Visit
Admission: RE | Admit: 2023-01-11 | Discharge: 2023-01-11 | Disposition: A | Discharge status: Home / Self Care | Payer: Medicare Other | Source: Ambulatory Visit | Attending: Neurosurgery | Admitting: Neurosurgery

## 2023-01-11 ENCOUNTER — Ambulatory Visit
Admission: RE | Admit: 2023-01-11 | Discharge: 2023-01-11 | Discharge status: Home / Self Care | Payer: Medicare Other | Source: Ambulatory Visit | Attending: Neurosurgery | Admitting: Neurosurgery

## 2023-01-11 DIAGNOSIS — M8000XA Age-related osteoporosis with current pathological fracture, unspecified site, initial encounter for fracture: Secondary | ICD-10-CM

## 2023-01-17 ENCOUNTER — Other Ambulatory Visit: Payer: Self-pay | Admitting: Nurse Practitioner

## 2023-01-17 DIAGNOSIS — Z1231 Encounter for screening mammogram for malignant neoplasm of breast: Secondary | ICD-10-CM

## 2023-02-08 ENCOUNTER — Ambulatory Visit: Payer: PRIVATE HEALTH INSURANCE

## 2023-03-28 NOTE — Progress Notes (Signed)
This encounter was created in error - please disregard.

## 2023-12-07 ENCOUNTER — Telehealth: Payer: Self-pay | Admitting: Neurology

## 2023-12-07 NOTE — Telephone Encounter (Signed)
 Inquiry of an earlier appointment, pt will f/u later

## 2024-01-18 ENCOUNTER — Ambulatory Visit: Admitting: Neurology

## 2024-01-24 ENCOUNTER — Other Ambulatory Visit: Payer: Self-pay | Admitting: Family Medicine

## 2024-01-24 DIAGNOSIS — Z1231 Encounter for screening mammogram for malignant neoplasm of breast: Secondary | ICD-10-CM

## 2024-02-15 ENCOUNTER — Ambulatory Visit: Payer: PRIVATE HEALTH INSURANCE

## 2024-04-01 ENCOUNTER — Ambulatory Visit: Admitting: Neurology

## 2024-04-01 ENCOUNTER — Encounter: Payer: Self-pay | Admitting: Neurology

## 2024-07-30 ENCOUNTER — Encounter: Payer: Self-pay | Admitting: Physician Assistant

## 2024-09-11 ENCOUNTER — Encounter: Payer: Self-pay | Admitting: Physician Assistant
